# Patient Record
Sex: Female | Born: 1985 | Race: White | Hispanic: No | Marital: Single | Smoking: Current every day smoker
Health system: Southern US, Community
[De-identification: ages and names within clinical notes are randomized; demographics above are authoritative.]

## PROBLEM LIST (undated history)

## (undated) DIAGNOSIS — F909 Attention-deficit hyperactivity disorder, unspecified type: Secondary | ICD-10-CM

## (undated) HISTORY — PX: WISDOM TOOTH EXTRACTION: SHX21

---
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
## 2003-02-10 ENCOUNTER — Other Ambulatory Visit: Admission: RE | Admit: 2003-02-10 | Discharge: 2003-02-10 | Payer: Self-pay | Admitting: Obstetrics and Gynecology

## 2003-02-11 ENCOUNTER — Other Ambulatory Visit: Admission: RE | Admit: 2003-02-11 | Discharge: 2003-02-11 | Payer: Self-pay | Admitting: Obstetrics and Gynecology

## 2003-06-14 ENCOUNTER — Encounter: Admission: RE | Admit: 2003-06-14 | Discharge: 2003-06-14 | Payer: Self-pay | Admitting: Obstetrics and Gynecology

## 2003-08-31 ENCOUNTER — Inpatient Hospital Stay (HOSPITAL_COMMUNITY): Admission: AD | Admit: 2003-08-31 | Discharge: 2003-08-31 | Payer: Self-pay | Admitting: Obstetrics and Gynecology

## 2003-09-01 ENCOUNTER — Inpatient Hospital Stay (HOSPITAL_COMMUNITY): Admission: AD | Admit: 2003-09-01 | Discharge: 2003-09-03 | Payer: Self-pay | Admitting: Obstetrics and Gynecology

## 2003-09-05 ENCOUNTER — Encounter: Admission: RE | Admit: 2003-09-05 | Discharge: 2003-10-05 | Payer: Self-pay | Admitting: Obstetrics and Gynecology

## 2003-10-06 ENCOUNTER — Other Ambulatory Visit: Admission: RE | Admit: 2003-10-06 | Discharge: 2003-10-06 | Payer: Self-pay | Admitting: Obstetrics and Gynecology

## 2003-10-06 ENCOUNTER — Encounter: Admission: RE | Admit: 2003-10-06 | Discharge: 2003-11-05 | Payer: Self-pay | Admitting: Obstetrics and Gynecology

## 2003-11-06 ENCOUNTER — Encounter: Admission: RE | Admit: 2003-11-06 | Discharge: 2003-12-06 | Payer: Self-pay | Admitting: Obstetrics and Gynecology

## 2003-11-10 ENCOUNTER — Emergency Department (HOSPITAL_COMMUNITY): Admission: EM | Admit: 2003-11-10 | Discharge: 2003-11-10 | Payer: Self-pay | Admitting: Emergency Medicine

## 2007-06-23 ENCOUNTER — Emergency Department (HOSPITAL_COMMUNITY): Admission: EM | Admit: 2007-06-23 | Discharge: 2007-06-23 | Payer: Self-pay | Admitting: Emergency Medicine

## 2007-09-08 ENCOUNTER — Emergency Department (HOSPITAL_COMMUNITY): Admission: EM | Admit: 2007-09-08 | Discharge: 2007-09-08 | Payer: Self-pay | Admitting: Emergency Medicine

## 2007-12-03 ENCOUNTER — Emergency Department (HOSPITAL_COMMUNITY): Admission: EM | Admit: 2007-12-03 | Discharge: 2007-12-03 | Payer: Self-pay | Admitting: Emergency Medicine

## 2008-06-13 ENCOUNTER — Emergency Department (HOSPITAL_COMMUNITY): Admission: EM | Admit: 2008-06-13 | Discharge: 2008-06-13 | Payer: Self-pay | Admitting: Emergency Medicine

## 2009-01-08 ENCOUNTER — Inpatient Hospital Stay (HOSPITAL_COMMUNITY): Admission: AD | Admit: 2009-01-08 | Discharge: 2009-01-08 | Payer: Self-pay | Admitting: Obstetrics and Gynecology

## 2009-01-13 ENCOUNTER — Emergency Department (HOSPITAL_COMMUNITY): Admission: EM | Admit: 2009-01-13 | Discharge: 2009-01-13 | Payer: Self-pay | Admitting: Emergency Medicine

## 2009-01-25 ENCOUNTER — Inpatient Hospital Stay (HOSPITAL_COMMUNITY): Admission: AD | Admit: 2009-01-25 | Discharge: 2009-01-26 | Payer: Self-pay | Admitting: Obstetrics and Gynecology

## 2009-01-29 ENCOUNTER — Inpatient Hospital Stay (HOSPITAL_COMMUNITY): Admission: AD | Admit: 2009-01-29 | Discharge: 2009-02-01 | Payer: Self-pay | Admitting: Obstetrics & Gynecology

## 2010-11-12 LAB — URINE MICROSCOPIC-ADD ON

## 2010-11-12 LAB — CBC
HCT: 34.5 % — ABNORMAL LOW (ref 36.0–46.0)
Hemoglobin: 10.9 g/dL — ABNORMAL LOW (ref 12.0–15.0)
Hemoglobin: 12 g/dL (ref 12.0–15.0)
MCHC: 34.5 g/dL (ref 30.0–36.0)
MCHC: 34.8 g/dL (ref 30.0–36.0)
MCV: 89.9 fL (ref 78.0–100.0)
Platelets: 253 10*3/uL (ref 150–400)
RBC: 3.83 MIL/uL — ABNORMAL LOW (ref 3.87–5.11)
RDW: 13.6 % (ref 11.5–15.5)
RDW: 13.6 % (ref 11.5–15.5)
WBC: 16.3 10*3/uL — ABNORMAL HIGH (ref 4.0–10.5)

## 2010-11-12 LAB — GC/CHLAMYDIA PROBE AMP, GENITAL
Chlamydia, DNA Probe: NEGATIVE
GC Probe Amp, Genital: NEGATIVE

## 2010-11-12 LAB — URINALYSIS, ROUTINE W REFLEX MICROSCOPIC
Hgb urine dipstick: NEGATIVE
Specific Gravity, Urine: 1.03 (ref 1.005–1.030)
Urobilinogen, UA: 1 mg/dL (ref 0.0–1.0)

## 2010-11-12 LAB — WET PREP, GENITAL

## 2010-12-21 NOTE — Op Note (Signed)
NAMEQUINCY, Virginia Wong                        ACCOUNT NO.:  [id number]   MEDICAL RECORD NO.:  [id number]                   PATIENT TYPE:  INP   LOCATION:  9117                                 FACILITY:  [facility]   PHYSICIAN:  Dineen Kid. Rana Snare, M.D.                 DATE OF BIRTH:  [birth year]   DATE OF PROCEDURE:  09/02/2003  DATE OF DISCHARGE:                                 OPERATIVE REPORT   The patient presented to the hospital in active labor with spontaneous  rupture of membranes.  She progressed rapidly to complete.  Did not receive  pain medication or epidural due to patient's request upon complete dilation.  The patient was having difficulty with controlling her anxiety and emotions  as were her husband and family members.  Upon my cervical check, the husband  was inappropriately using the video camera near the vagina and when asked to  refrain from using the camera until the baby was delivered, he got very  upset with the nurse and was upset also with me.  After pushing for  approximately 10-15 minutes, we had a fetal heart rate deceleration to 60-70  beats per minute for approximately 4-5 minutes.  Responded with position  change and oxygen.  However, the patient was being noncompliant as far as  turning and very argumentative with the nurse.   I discussed with the patient different options to get the baby delivered.  The baby was in the right occiput anterior presentation with a +3 station,  and we continued to have big decels with pushing.  We did get some recovery  with rest on the left lateral position change and oxygen.  Discussed  cesarean section, use of forceps, or vacuum for an operative vaginal  delivery.  After discussion with the patient, the patient requested the  vacuum extractor.  Did discuss the 1 in 50,000 risk of suffocating or bleed,  at which time the husband began shouting No, you're not doing that.  You're  going to suck out the baby's brain.  We're doing a  C-section.  At that  point I stated, Fine, let's go ahead and do the cesarean section  and  began to proceed to call the operating room, at which time the husband  shouted at me that he going to carry the video camera into the operating  room and do anything he damn well wants to.  I told him that he would only  be able to take it in there if he was in control and not video tape anything  he wants but only as directed, at which time he stated he wanted a new  doctor.  I told him that at this point, for care with the fetal heart rate  in jeopardy, that my responsibility was for the mother and the baby and that  he would need to leave the room so that I could properly take  care of the  mother and the baby.  After refusing to do so, Isla Pence was contacted,  who was the head nurse on call.  She was able to get him to leave the room  and take him down the hall and talk with him.   The patient then consented to the vacuum extractor after we explained the  risks.  It was gently applied, and because of caput on the fetus and the  patient not able to push well and actually clamping her legs down when the  fetus would crown, unable to keep the vacuum on because she would clamp down  around the vacuum so after two easy attempts bringing the fetus to a  crowning position, the vacuum was easily removed and at this time the heart  rate was in the 130s to 140s.  Because the heart rate had resumed a normal  position other than with variables with pushing, I allowed her to push on  her own and after several contractions with pushing, she did deliver  spontaneously.  The nose and pharynx were suctioned.  The shoulders were  easily reduced.  The cord was clamped, cut and baby was resuscitated on the  mother's abdomen.   Blood gas was obtained with a pH arterial of 7.27.  The placenta was then  delivered spontaneously and intact three-vessel cord.  One percent Xylocaine  was used to infiltrate the  lower portion of the vagina and perineum, where  she had a small first-degree laceration into the vagina, which was repaired  with 2-0 chromic.  The uterus was massaged firm with minimal bleeding noted.  Estimated blood loss of 400 cc.  The mother and the baby were doing very  well.   At this point the father had been back in the room for the delivery and had  been markedly calmed down and appeared to be apologetic and thankful for the  safe delivery.                                               Dineen Kid Rana Snare, M.D.    DCL/MEDQ  D:  09/02/2003  T:  09/02/2003  Job:  [id number]

## 2011-04-30 LAB — URINALYSIS, ROUTINE W REFLEX MICROSCOPIC
Bilirubin Urine: NEGATIVE
Glucose, UA: NEGATIVE
Specific Gravity, Urine: 1.009
Urobilinogen, UA: 0.2
pH: 6.5

## 2011-04-30 LAB — POCT PREGNANCY, URINE
Operator id: 288331
Preg Test, Ur: NEGATIVE

## 2011-04-30 LAB — URINE MICROSCOPIC-ADD ON

## 2011-04-30 LAB — WET PREP, GENITAL

## 2011-05-08 LAB — CBC
HCT: 39.1
Hemoglobin: 13.1
MCHC: 33.5
MCV: 89.3
Platelets: 210
RBC: 4.38
RDW: 13.1
WBC: 7.5

## 2011-05-08 LAB — URINALYSIS, ROUTINE W REFLEX MICROSCOPIC
Bilirubin Urine: NEGATIVE
Glucose, UA: NEGATIVE
Hgb urine dipstick: NEGATIVE
Ketones, ur: NEGATIVE
Nitrite: NEGATIVE
Protein, ur: NEGATIVE
Specific Gravity, Urine: 1.021
Urobilinogen, UA: 0.2
pH: 6.5

## 2011-05-08 LAB — RPR: RPR Ser Ql: NONREACTIVE

## 2011-05-08 LAB — POCT I-STAT, CHEM 8
BUN: 9
Calcium, Ion: 1.23
Chloride: 102
HCT: 39
Potassium: 3.6

## 2011-05-08 LAB — GC/CHLAMYDIA PROBE AMP, GENITAL
Chlamydia, DNA Probe: NEGATIVE
GC Probe Amp, Genital: NEGATIVE

## 2011-05-08 LAB — HCG, QUANTITATIVE, PREGNANCY: hCG, Beta Chain, Quant, S: 25714 — ABNORMAL HIGH

## 2011-05-08 LAB — POCT PREGNANCY, URINE: Preg Test, Ur: POSITIVE

## 2011-05-08 LAB — WET PREP, GENITAL
Trich, Wet Prep: NONE SEEN
Yeast Wet Prep HPF POC: NONE SEEN

## 2011-05-08 LAB — ABO/RH: ABO/RH(D): A POS

## 2013-10-15 ENCOUNTER — Emergency Department (HOSPITAL_COMMUNITY): Payer: Medicaid Other

## 2013-10-15 ENCOUNTER — Emergency Department (HOSPITAL_COMMUNITY)
Admission: EM | Admit: 2013-10-15 | Discharge: 2013-10-15 | Disposition: A | Discharge status: Home / Self Care | Payer: Medicaid Other | Attending: Emergency Medicine | Admitting: Emergency Medicine

## 2013-10-15 ENCOUNTER — Encounter (HOSPITAL_COMMUNITY): Payer: Self-pay | Admitting: Emergency Medicine

## 2013-10-15 DIAGNOSIS — K625 Hemorrhage of anus and rectum: Secondary | ICD-10-CM

## 2013-10-15 DIAGNOSIS — Z79899 Other long term (current) drug therapy: Secondary | ICD-10-CM | POA: Insufficient documentation

## 2013-10-15 DIAGNOSIS — Z3202 Encounter for pregnancy test, result negative: Secondary | ICD-10-CM | POA: Insufficient documentation

## 2013-10-15 DIAGNOSIS — N898 Other specified noninflammatory disorders of vagina: Secondary | ICD-10-CM | POA: Insufficient documentation

## 2013-10-15 DIAGNOSIS — R109 Unspecified abdominal pain: Secondary | ICD-10-CM

## 2013-10-15 DIAGNOSIS — K648 Other hemorrhoids: Secondary | ICD-10-CM | POA: Insufficient documentation

## 2013-10-15 DIAGNOSIS — F17200 Nicotine dependence, unspecified, uncomplicated: Secondary | ICD-10-CM | POA: Insufficient documentation

## 2013-10-15 LAB — WET PREP, GENITAL
Trich, Wet Prep: NONE SEEN
WBC, Wet Prep HPF POC: NONE SEEN
Yeast Wet Prep HPF POC: NONE SEEN

## 2013-10-15 LAB — CBC WITH DIFFERENTIAL/PLATELET
Basophils Absolute: 0 10*3/uL (ref 0.0–0.1)
Basophils Relative: 0 % (ref 0–1)
EOS PCT: 1 % (ref 0–5)
Eosinophils Absolute: 0.1 10*3/uL (ref 0.0–0.7)
HEMATOCRIT: 50.4 % — AB (ref 36.0–46.0)
Hemoglobin: 17.5 g/dL — ABNORMAL HIGH (ref 12.0–15.0)
LYMPHS ABS: 1.8 10*3/uL (ref 0.7–4.0)
LYMPHS PCT: 14 % (ref 12–46)
MCH: 31.4 pg (ref 26.0–34.0)
MCHC: 34.7 g/dL (ref 30.0–36.0)
MCV: 90.3 fL (ref 78.0–100.0)
MONO ABS: 1.1 10*3/uL — AB (ref 0.1–1.0)
Monocytes Relative: 8 % (ref 3–12)
Neutro Abs: 9.7 10*3/uL — ABNORMAL HIGH (ref 1.7–7.7)
Neutrophils Relative %: 76 % (ref 43–77)
PLATELETS: 347 10*3/uL (ref 150–400)
RBC: 5.58 MIL/uL — AB (ref 3.87–5.11)
RDW: 12.6 % (ref 11.5–15.5)
WBC: 12.7 10*3/uL — AB (ref 4.0–10.5)

## 2013-10-15 LAB — URINALYSIS, ROUTINE W REFLEX MICROSCOPIC
Glucose, UA: NEGATIVE mg/dL
HGB URINE DIPSTICK: NEGATIVE
KETONES UR: 15 mg/dL — AB
Nitrite: NEGATIVE
PROTEIN: NEGATIVE mg/dL
Specific Gravity, Urine: 1.038 — ABNORMAL HIGH (ref 1.005–1.030)
Urobilinogen, UA: 1 mg/dL (ref 0.0–1.0)
pH: 5 (ref 5.0–8.0)

## 2013-10-15 LAB — COMPREHENSIVE METABOLIC PANEL
ALT: 19 U/L (ref 0–35)
AST: 19 U/L (ref 0–37)
Albumin: 4.5 g/dL (ref 3.5–5.2)
Alkaline Phosphatase: 64 U/L (ref 39–117)
BILIRUBIN TOTAL: 0.8 mg/dL (ref 0.3–1.2)
BUN: 13 mg/dL (ref 6–23)
CALCIUM: 9.5 mg/dL (ref 8.4–10.5)
CO2: 27 meq/L (ref 19–32)
Chloride: 98 mEq/L (ref 96–112)
Creatinine, Ser: 0.77 mg/dL (ref 0.50–1.10)
GLUCOSE: 110 mg/dL — AB (ref 70–99)
Potassium: 4.3 mEq/L (ref 3.7–5.3)
Sodium: 138 mEq/L (ref 137–147)
Total Protein: 7.3 g/dL (ref 6.0–8.3)

## 2013-10-15 LAB — URINE MICROSCOPIC-ADD ON

## 2013-10-15 LAB — POC OCCULT BLOOD, ED: FECAL OCCULT BLD: POSITIVE — AB

## 2013-10-15 LAB — POC URINE PREG, ED: Preg Test, Ur: NEGATIVE

## 2013-10-15 MED ORDER — DICYCLOMINE HCL 10 MG PO CAPS: 10.0000 mg | ORAL_CAPSULE | Freq: Three times a day (TID) | ORAL | Status: DC

## 2013-10-15 MED ORDER — DOCUSATE SODIUM 100 MG PO CAPS: 100.0000 mg | ORAL_CAPSULE | Freq: Two times a day (BID) | ORAL | Status: DC

## 2013-10-15 MED ORDER — DICYCLOMINE HCL 10 MG PO CAPS
10.0000 mg | ORAL_CAPSULE | Freq: Once | ORAL | Status: AC
  Administered 2013-10-15: 10 mg via ORAL
  Filled 2013-10-15: qty 1

## 2013-10-15 NOTE — ED Notes (Signed)
Assisted PA with pelvic examination. Specimen sent.

## 2013-10-15 NOTE — ED Notes (Addendum)
Pt presents with c/o of rectal bleeding. Pt reports that she has had diarrhea since 10:00 this morning. Pt reports that she has only had one episode of rectal bleeding, which was at 1500 and the patient reports that the feces was "nothing but blood" and was bright red. Pt reports nausea, however denies emesis. Pt reports intermittent mid-lower abdominal pain. Pt also reports fatigue and weakness which began this morning.  Pt is A/O x4, in NAD, and vitals are WDL.

## 2013-10-15 NOTE — ED Provider Notes (Signed)
Medical screening examination/treatment/procedure(s) were performed by non-physician practitioner and as supervising physician I was immediately available for consultation/collaboration.   EKG Interpretation None        Richardean Canalavid H Paisley Grajeda, MD 10/15/13 2332

## 2013-10-15 NOTE — Discharge Instructions (Signed)
Abdominal Pain, Women °Abdominal (stomach, pelvic, or belly) pain can be caused by many things. It is important to tell your doctor: °· The location of the pain. °· Does it come and go or is it present all the time? °· Are there things that start the pain (eating certain foods, exercise)? °· Are there other symptoms associated with the pain (fever, nausea, vomiting, diarrhea)? °All of this is helpful to know when trying to find the cause of the pain. °CAUSES  °· Stomach: virus or bacteria infection, or ulcer. °· Intestine: appendicitis (inflamed appendix), regional ileitis (Crohn's disease), ulcerative colitis (inflamed colon), irritable bowel syndrome, diverticulitis (inflamed diverticulum of the colon), or cancer of the stomach or intestine. °· Gallbladder disease or stones in the gallbladder. °· Kidney disease, kidney stones, or infection. °· Pancreas infection or cancer. °· Fibromyalgia (pain disorder). °· Diseases of the female organs: °· Uterus: fibroid (non-cancerous) tumors or infection. °· Fallopian tubes: infection or tubal pregnancy. °· Ovary: cysts or tumors. °· Pelvic adhesions (scar tissue). °· Endometriosis (uterus lining tissue growing in the pelvis and on the pelvic organs). °· Pelvic congestion syndrome (female organs filling up with blood just before the menstrual period). °· Pain with the menstrual period. °· Pain with ovulation (producing an egg). °· Pain with an IUD (intrauterine device, birth control) in the uterus. °· Cancer of the female organs. °· Functional pain (pain not caused by a disease, may improve without treatment). °· Psychological pain. °· Depression. °DIAGNOSIS  °Your doctor will decide the seriousness of your pain by doing an examination. °· Blood tests. °· X-rays. °· Ultrasound. °· CT scan (computed tomography, special type of X-ray). °· MRI (magnetic resonance imaging). °· Cultures, for infection. °· Barium enema (dye inserted in the large intestine, to better view it with  X-rays). °· Colonoscopy (looking in intestine with a lighted tube). °· Laparoscopy (minor surgery, looking in abdomen with a lighted tube). °· Major abdominal exploratory surgery (looking in abdomen with a large incision). °TREATMENT  °The treatment will depend on the cause of the pain.  °· Many cases can be observed and treated at home. °· Over-the-counter medicines recommended by your caregiver. °· Prescription medicine. °· Antibiotics, for infection. °· Birth control pills, for painful periods or for ovulation pain. °· Hormone treatment, for endometriosis. °· Nerve blocking injections. °· Physical therapy. °· Antidepressants. °· Counseling with a psychologist or psychiatrist. °· Minor or major surgery. °HOME CARE INSTRUCTIONS  °· Do not take laxatives, unless directed by your caregiver. °· Take over-the-counter pain medicine only if ordered by your caregiver. Do not take aspirin because it can cause an upset stomach or bleeding. °· Try a clear liquid diet (broth or water) as ordered by your caregiver. Slowly move to a bland diet, as tolerated, if the pain is related to the stomach or intestine. °· Have a thermometer and take your temperature several times a day, and record it. °· Bed rest and sleep, if it helps the pain. °· Avoid sexual intercourse, if it causes pain. °· Avoid stressful situations. °· Keep your follow-up appointments and tests, as your caregiver orders. °· If the pain does not go away with medicine or surgery, you may try: °· Acupuncture. °· Relaxation exercises (yoga, meditation). °· Group therapy. °· Counseling. °SEEK MEDICAL CARE IF:  °· You notice certain foods cause stomach pain. °· Your home care treatment is not helping your pain. °· You need stronger pain medicine. °· You want your IUD removed. °· You feel faint or   lightheaded.  You develop nausea and vomiting.  You develop a rash.  You are having side effects or an allergy to your medicine. SEEK IMMEDIATE MEDICAL CARE IF:   Your  pain does not go away or gets worse.  You have a fever.  Your pain is felt only in portions of the abdomen. The right side could possibly be appendicitis. The left lower portion of the abdomen could be colitis or diverticulitis.  You are passing blood in your stools (bright red or black tarry stools, with or without vomiting).  You have blood in your urine.  You develop chills, with or without a fever.  You pass out. MAKE SURE YOU:   Understand these instructions.  Will watch your condition.  Will get help right away if you are not doing well or get worse. Document Released: 05/19/2007 Document Revised: 10/14/2011 Document Reviewed: 06/08/2009 [facility] Patient Information 2014 [place], [place].  Bloody Stools Bloody stools means there is blood in your poop (stool). It is a sign that there is a problem somewhere in the digestive system. It is important for your doctor to find the cause of your bleeding, so the problem can be treated.  HOME CARE  Only take medicine as told by your doctor.  Eat foods with fiber (prunes, bran cereals).  Drink enough fluids to keep your pee (urine) clear or pale yellow.  Sit in warm water (sitz bath) for 10 to 15 minutes as told by your doctor.  Know how to take your medicines (enemas, suppositories) if advised by your doctor.  Watch for signs that you are getting better or getting worse. GET HELP RIGHT AWAY IF:   You are not getting better.  You start to get better but then get worse again.  You have new problems.  You have severe bleeding from the place where poop comes out (rectum) that does not stop.  You throw up (vomit) blood.  You feel weak or pass out (faint).  You have a fever. MAKE SURE YOU:   Understand these instructions.  Will watch your condition.  Will get help right away if you are not doing well or get worse. Document Released: 07/10/2009 Document Revised: 10/14/2011 Document Reviewed: 12/07/2010 [facility]  Patient Information 2014 [place], [place].  Rectal Bleeding Rectal bleeding is when blood passes out of the anus. It is usually a sign that something is wrong. It may not be serious, but it should always be evaluated. Rectal bleeding may present as bright red blood or extremely dark stools. The color may range from dark red or maroon to black (like tar). It is important that the cause of rectal bleeding be identified so treatment can be started and the problem corrected. CAUSES   Hemorrhoids. These are enlarged (dilated) blood vessels or veins in the anal or rectal area.  Fistulas. Theseare abnormal, burrowing channels that usually run from inside the rectum to the skin around the anus. They can bleed.  Anal fissures. This is a tear in the tissue of the anus. Bleeding occurs with bowel movements.  Diverticulosis. This is a condition in which pockets or sacs project from the bowel wall. Occasionally, the sacs can bleed.  Diverticulitis. Thisis an infection involving diverticulosis of the colon.  Proctitis and colitis. These are conditions in which the rectum, colon, or both, can become inflamed and pitted (ulcerated).  Polyps and cancer. Polyps are non-cancerous (benign) growths in the colon that may bleed. Certain types of polyps turn into cancer.  Protrusion of the  rectum. Part of the rectum can project from the anus and bleed.  Certain medicines.  Intestinal infections.  Blood vessel abnormalities. HOME CARE INSTRUCTIONS  Eat a high-fiber diet to keep your stool soft.  Limit activity.  Drink enough fluids to keep your urine clear or pale yellow.  Warm baths may be useful to soothe rectal pain.  Follow up with your caregiver as directed. SEEK IMMEDIATE MEDICAL CARE IF:  You develop increased bleeding.  You have black or dark red stools.  You vomit blood or material that looks like coffee grounds.  You have abdominal pain or tenderness.  You have a fever.  You feel  weak, nauseous, or you faint.  You have severe rectal pain or you are unable to have a bowel movement. MAKE SURE YOU:  Understand these instructions.  Will watch your condition.  Will get help right away if you are not doing well or get worse. Document Released: 01/11/2002 Document Revised: 10/14/2011 Document Reviewed: 01/06/2011 [facility] Patient Information 2014 [place]

## 2013-10-15 NOTE — ED Provider Notes (Signed)
CSN: [id number]     Arrival date & time 10/15/13  1553 History   First MD Initiated Contact with Patient 10/15/13 1754     Chief Complaint  Patient presents with  . Rectal Bleeding     (Consider location/radiation/quality/duration/timing/severity/associated sxs/prior Treatment) HPI Comments: Patient is otherwise healthy 28 year old female who presents to the ED with BRBPR today.  She states that over the past month she has been having episodic crampy lower abdominal pain, she states that she is also noting that she has to strain to have a bowel movement, she reports that once earlier in the month she had stool that had streaks of blood in the stool - she reports today she strained then had hard stools and then bloody diarrhea.  She reports frequently after the bouts of constipation, she will experience the diarrhea and the cramps.  She denies fever, chills, nausea or vomiting but reports some white vaginal discharge associated with this.  She reports no family or personal history of colon cancer, crohn's disease or ulcerative colitis.    Patient is a 28 y.o. female presenting with hematochezia. The history is provided by the patient. No language interpreter was used.  Rectal Bleeding Quality:  Bright red Amount:  Moderate Duration:  4 hours Timing:  Constant Progression:  Worsening Chronicity:  Recurrent Context: constipation, defecation, diarrhea and spontaneously   Context: not anal fissures, not foreign body, not hemorrhoids, not rectal injury and not rectal pain   Similar prior episodes: no   Relieved by:  Nothing Worsened by:  Nothing tried Ineffective treatments:  None tried Associated symptoms: abdominal pain   Associated symptoms: no dizziness, no epistaxis, no fever, no hematemesis, no light-headedness, no loss of consciousness, no recent illness and no vomiting   Risk factors: no anticoagulant use, no hx of colorectal cancer and no hx of IBD     History reviewed. No pertinent  past medical history. Past Surgical History  Procedure Laterality Date  . Wisdom tooth extraction     No family history on file. History  Substance Use Topics  . Smoking status: Current Every Day Smoker -- 0.50 packs/day for 5 years    Types: Cigarettes  . Smokeless tobacco: Never Used  . Alcohol Use: 12.0 oz/week    20 Shots of liquor per week   OB History   Grav Para Term Preterm Abortions TAB SAB Ect Mult Living                 Review of Systems  Constitutional: Negative for fever.  HENT: Negative for nosebleeds.   Gastrointestinal: Positive for abdominal pain and hematochezia. Negative for vomiting and hematemesis.  Neurological: Negative for dizziness, loss of consciousness and light-headedness.  All other systems reviewed and are negative.      Allergies  Review of patient's allergies indicates no known allergies.  Home Medications   Current Outpatient Rx  Name  Route  Sig  Dispense  Refill  . amphetamine-dextroamphetamine (ADDERALL) 20 MG tablet   Oral   Take 20 mg by mouth 2 (two) times daily.         . naproxen sodium (ANAPROX) 220 MG tablet   Oral   Take 440 mg by mouth as needed (pain).         Marland Kitchen. omeprazole (PRILOSEC) 40 MG capsule   Oral   Take 40 mg by mouth daily.          BP 133/85  Pulse 90  Temp(Src) 98.5 F (36.9  C) (Oral)  Resp 18  SpO2 98%  LMP 09/17/2013 Physical Exam  Nursing note and vitals reviewed. Constitutional: She is oriented to person, place, and time. She appears well-developed and well-nourished. No distress.  HENT:  Head: Normocephalic and atraumatic.  Right Ear: External ear normal.  Left Ear: External ear normal.  Nose: Nose normal.  Mouth/Throat: Oropharynx is clear and moist. No oropharyngeal exudate.  Eyes: Conjunctivae are normal. Pupils are equal, round, and reactive to light. No scleral icterus.  Neck: Normal range of motion. Neck supple.  Cardiovascular: Normal rate, regular rhythm and normal heart  sounds.  Exam reveals no gallop and no friction rub.   No murmur heard. Pulmonary/Chest: Effort normal and breath sounds normal. No respiratory distress. She has no wheezes. She has no rales. She exhibits no tenderness.  Abdominal: Soft. Bowel sounds are normal. She exhibits no distension and no mass. There is tenderness. There is no rebound and no guarding.    Genitourinary: Rectal exam shows internal hemorrhoid. Rectal exam shows no external hemorrhoid, no fissure, no tenderness and anal tone normal. There is no rash, tenderness or lesion on the right labia. There is no rash, tenderness or lesion on the left labia. Uterus is tender. Uterus is not enlarged. Cervix exhibits no motion tenderness and no discharge. Right adnexum displays no mass, no tenderness and no fullness. Left adnexum displays no mass, no tenderness and no fullness. No tenderness or bleeding around the vagina. No foreign body around the vagina. No vaginal discharge found.  Musculoskeletal: Normal range of motion. She exhibits no edema and no tenderness.  Lymphadenopathy:    She has no cervical adenopathy.  Neurological: She is alert and oriented to person, place, and time. She exhibits normal muscle tone. Coordination normal.  Skin: Skin is warm and dry. No rash noted. No erythema. No pallor.  Psychiatric: She has a normal mood and affect. Her behavior is normal. Judgment and thought content normal.    ED Course  Procedures (including critical care time) Labs Review Labs Reviewed  CBC WITH DIFFERENTIAL - Abnormal; Notable for the following:    WBC 12.7 (*)    RBC 5.58 (*)    Hemoglobin 17.5 (*)    HCT 50.4 (*)    Neutro Abs 9.7 (*)    Monocytes Absolute 1.1 (*)    All other components within normal limits  COMPREHENSIVE METABOLIC PANEL - Abnormal; Notable for the following:    Glucose, Bld 110 (*)    All other components within normal limits  URINALYSIS, ROUTINE W REFLEX MICROSCOPIC - Abnormal; Notable for the  following:    Color, Urine AMBER (*)    APPearance CLOUDY (*)    Specific Gravity, Urine 1.038 (*)    Bilirubin Urine MODERATE (*)    Ketones, ur 15 (*)    Leukocytes, UA TRACE (*)    All other components within normal limits  POC OCCULT BLOOD, ED - Abnormal; Notable for the following:    Fecal Occult Bld POSITIVE (*)    All other components within normal limits  GC/CHLAMYDIA PROBE AMP  WET PREP, GENITAL  URINE MICROSCOPIC-ADD ON  RPR  HIV ANTIBODY (ROUTINE TESTING)  POC URINE PREG, ED   Imaging Review Dg Abd Acute W/chest  10/15/2013   CLINICAL DATA:  Chest pain rectal bleeding  EXAM: ACUTE ABDOMEN SERIES (ABDOMEN 2 VIEW & CHEST 1 VIEW)  COMPARISON:  None.  FINDINGS: There is no evidence of dilated bowel loops or free intraperitoneal air. No radiopaque calculi  or other significant radiographic abnormality is seen. Heart size and mediastinal contours are within normal limits. Both lungs are clear.  IMPRESSION: No acute abnormality noted.   Electronically Signed   By: Alcide Clever M.D.   On: 10/15/2013 19:01     EKG Interpretation None      Results for orders placed during the hospital encounter of 10/15/13  WET PREP, GENITAL      Result Value Ref Range   Yeast Wet Prep HPF POC NONE SEEN  NONE SEEN   Trich, Wet Prep NONE SEEN  NONE SEEN   Clue Cells Wet Prep HPF POC RARE (*) NONE SEEN   WBC, Wet Prep HPF POC NONE SEEN  NONE SEEN  CBC WITH DIFFERENTIAL      Result Value Ref Range   WBC 12.7 (*) 4.0 - 10.5 K/uL   RBC 5.58 (*) 3.87 - 5.11 MIL/uL   Hemoglobin 17.5 (*) 12.0 - 15.0 g/dL   HCT 16.1 (*) 09.6 - 04.5 %   MCV 90.3  78.0 - 100.0 fL   MCH 31.4  26.0 - 34.0 pg   MCHC 34.7  30.0 - 36.0 g/dL   RDW 40.9  81.1 - 91.4 %   Platelets 347  150 - 400 K/uL   Neutrophils Relative % 76  43 - 77 %   Neutro Abs 9.7 (*) 1.7 - 7.7 K/uL   Lymphocytes Relative 14  12 - 46 %   Lymphs Abs 1.8  0.7 - 4.0 K/uL   Monocytes Relative 8  3 - 12 %   Monocytes Absolute 1.1 (*) 0.1 - 1.0 K/uL     Eosinophils Relative 1  0 - 5 %   Eosinophils Absolute 0.1  0.0 - 0.7 K/uL   Basophils Relative 0  0 - 1 %   Basophils Absolute 0.0  0.0 - 0.1 K/uL  COMPREHENSIVE METABOLIC PANEL      Result Value Ref Range   Sodium 138  137 - 147 mEq/L   Potassium 4.3  3.7 - 5.3 mEq/L   Chloride 98  96 - 112 mEq/L   CO2 27  19 - 32 mEq/L   Glucose, Bld 110 (*) 70 - 99 mg/dL   BUN 13  6 - 23 mg/dL   Creatinine, Ser 7.82  0.50 - 1.10 mg/dL   Calcium 9.5  8.4 - 95.6 mg/dL   Total Protein 7.3  6.0 - 8.3 g/dL   Albumin 4.5  3.5 - 5.2 g/dL   AST 19  0 - 37 U/L   ALT 19  0 - 35 U/L   Alkaline Phosphatase 64  39 - 117 U/L   Total Bilirubin 0.8  0.3 - 1.2 mg/dL   GFR calc non Af Amer >90  >90 mL/min   GFR calc Af Amer >90  >90 mL/min  URINALYSIS, ROUTINE W REFLEX MICROSCOPIC      Result Value Ref Range   Color, Urine AMBER (*) YELLOW   APPearance CLOUDY (*) CLEAR   Specific Gravity, Urine 1.038 (*) 1.005 - 1.030   pH 5.0  5.0 - 8.0   Glucose, UA NEGATIVE  NEGATIVE mg/dL   Hgb urine dipstick NEGATIVE  NEGATIVE   Bilirubin Urine MODERATE (*) NEGATIVE   Ketones, ur 15 (*) NEGATIVE mg/dL   Protein, ur NEGATIVE  NEGATIVE mg/dL   Urobilinogen, UA 1.0  0.0 - 1.0 mg/dL   Nitrite NEGATIVE  NEGATIVE   Leukocytes, UA TRACE (*) NEGATIVE  URINE MICROSCOPIC-ADD ON  Result Value Ref Range   Squamous Epithelial / LPF RARE  RARE   WBC, UA 0-2  <3 WBC/hpf   RBC / HPF 0-2  <3 RBC/hpf   Bacteria, UA RARE  RARE   Urine-Other MUCOUS PRESENT    POC URINE PREG, ED      Result Value Ref Range   Preg Test, Ur NEGATIVE  NEGATIVE  POC OCCULT BLOOD, ED      Result Value Ref Range   Fecal Occult Bld POSITIVE (*) NEGATIVE   Dg Abd Acute W/chest  10/15/2013   CLINICAL DATA:  Chest pain rectal bleeding  EXAM: ACUTE ABDOMEN SERIES (ABDOMEN 2 VIEW & CHEST 1 VIEW)  COMPARISON:  None.  FINDINGS: There is no evidence of dilated bowel loops or free intraperitoneal air. No radiopaque calculi or other significant  radiographic abnormality is seen. Heart size and mediastinal contours are within normal limits. Both lungs are clear.  IMPRESSION: No acute abnormality noted.   Electronically Signed   By: Alcide Clever M.D.   On: 10/15/2013 19:01    Medications  dicyclomine (BENTYL) capsule 10 mg (10 mg Oral Given 10/15/13 2000)     MDM  Rectal Bleeding Abdominal pain   Patient is otherwise healthy 28 year old female who presents with crampy abdominal pain, rectal bleeding, HGB stable, heme positive from below, no need for transfusion, abdominal pain relieved with bentyl.  Plan on referral to GI if this continues.    Izola Price Marisue Humble, PA-C 10/15/13 2040

## 2013-10-15 NOTE — ED Notes (Signed)
Initial Contact - pt to RM1, changed to hospital gown.  Pt reports 1 episode BRBPR earlier today, reports diarrhea all day today.  Pt c/o 7/10 "cramping" lower abd pain.  Pt denies n/v, denies fevers/chills.  Denies other complaints.  Skin PWD.  A+Ox4.  MAEI.  NAD.

## 2013-10-16 LAB — GC/CHLAMYDIA PROBE AMP
CT PROBE, AMP APTIMA: NEGATIVE
GC Probe RNA: NEGATIVE

## 2013-10-16 LAB — RPR: RPR Ser Ql: NONREACTIVE

## 2013-10-16 LAB — HIV ANTIBODY (ROUTINE TESTING W REFLEX): HIV: NONREACTIVE

## 2018-03-23 ENCOUNTER — Other Ambulatory Visit: Payer: Self-pay

## 2018-03-23 ENCOUNTER — Emergency Department (INDEPENDENT_AMBULATORY_CARE_PROVIDER_SITE_OTHER)
Admission: EM | Admit: 2018-03-23 | Discharge: 2018-03-23 | Disposition: A | Discharge status: Home / Self Care | Payer: 59 | Source: Home / Self Care | Attending: Family Medicine | Admitting: Family Medicine

## 2018-03-23 DIAGNOSIS — R102 Pelvic and perineal pain unspecified side: Secondary | ICD-10-CM

## 2018-03-23 DIAGNOSIS — N760 Acute vaginitis: Secondary | ICD-10-CM

## 2018-03-23 DIAGNOSIS — J029 Acute pharyngitis, unspecified: Secondary | ICD-10-CM

## 2018-03-23 NOTE — Discharge Instructions (Signed)
°  You will be notified of today's lab results in about 2-3 days.    Please call to schedule a follow up appointment with a primary care provider for recheck of symptoms in 1 week if not improving.

## 2018-03-23 NOTE — ED Triage Notes (Signed)
Pt has been having a sore throat and vaginal pain x 2 days.

## 2018-03-23 NOTE — ED Provider Notes (Signed)
[facility]    CSN: [id number] Arrival date & time: 03/23/18  1058     History   Chief Complaint Chief Complaint  Patient presents with  . Sore Throat  . Vaginal Pain    HPI Virginia Wong is a 32 y.o. female.   HPI Virginia Wong is a 32 y.o. female presenting to UC with c/o sore throat and vaginal pain for about 2 days.  Throat pain is aching and sore, worse with swallowing, 3/10. Vaginal pain is itching and burning. She has had yeast infections in the past and notes she completed a course of antibiotics about 1 week ago for a UTI. Denies urinary symptoms. Denies fever, chills, cough, congestion, n/vd. No known sick contacts. Denies concern for STDs.   History reviewed. No pertinent past medical history.  There are no active problems to display for this patient.   Past Surgical History:  Procedure Laterality Date  . WISDOM TOOTH EXTRACTION      OB History   None      Home Medications    Prior to Admission medications   Medication Sig Start Date End Date Taking? Authorizing Provider  amphetamine-dextroamphetamine (ADDERALL) 20 MG tablet Take 20 mg by mouth 2 (two) times daily.    [provider]  dicyclomine (BENTYL) 10 MG capsule Take 1 capsule (10 mg total) by mouth 4 (four) times daily -  before meals and at bedtime. 10/15/13   Cherrie DistanceSanford, Frances, PA-C  docusate sodium (COLACE) 100 MG capsule Take 1 capsule (100 mg total) by mouth every 12 (twelve) hours. 10/15/13   Cherrie DistanceSanford, Frances, PA-C  naproxen sodium (ANAPROX) 220 MG tablet Take 440 mg by mouth as needed (pain).    [provider]  omeprazole (PRILOSEC) 40 MG capsule Take 40 mg by mouth daily.    [provider]    Family History History reviewed. No pertinent family history.  Social History Social History   Tobacco Use  . Smoking status: Current Every Day Smoker    Packs/day: 0.50    Years: 5.00    Pack years: 2.50    Types: Cigarettes  . Smokeless tobacco:  Never Used  Substance Use Topics  . Alcohol use: Yes    Alcohol/week: 20.0 standard drinks    Types: 20 Shots of liquor per week  . Drug use: No     Allergies   Patient has no known allergies.   Review of Systems Review of Systems  Constitutional: Negative for chills and fever.  HENT: Positive for sore throat. Negative for congestion, ear pain, trouble swallowing and voice change.   Respiratory: Negative for cough and shortness of breath.   Cardiovascular: Negative for chest pain and palpitations.  Gastrointestinal: Negative for abdominal pain, diarrhea, nausea and vomiting.  Genitourinary: Positive for vaginal pain (itching and burning). Negative for dysuria, frequency, hematuria, vaginal bleeding and vaginal discharge.  Musculoskeletal: Negative for arthralgias, back pain and myalgias.  Skin: Negative for rash.     Physical Exam Triage Vital Signs ED Triage Vitals  Enc Vitals Group     BP 03/23/18 1120 129/85     Pulse Rate 03/23/18 1120 73     Resp --      Temp 03/23/18 1120 98 F (36.7 C)     Temp Source 03/23/18 1120 Oral     SpO2 03/23/18 1120 95 %     Weight 03/23/18 1121 158 lb (71.7 kg)     Height 03/23/18 1121 5\' 5"  (1.651 m)  Head Circumference --      Peak Flow --      Pain Score 03/23/18 1121 1     Pain Loc --      Pain Edu? --      Excl. in GC? --    No data found.  Updated Vital Signs BP 129/85 (BP Location: Right Arm)   Pulse 73   Temp 98 F (36.7 C) (Oral)   Ht 5\' 5"  (1.651 m)   Wt 158 lb (71.7 kg)   SpO2 95%   BMI 26.29 kg/m   Visual Acuity Right Eye Distance:   Left Eye Distance:   Bilateral Distance:    Right Eye Near:   Left Eye Near:    Bilateral Near:     Physical Exam  Constitutional: She is oriented to person, place, and time. She appears well-developed and well-nourished.  Non-toxic appearance. She does not appear ill. No distress.  HENT:  Head: Normocephalic and atraumatic.  Right Ear: Tympanic membrane normal.    Left Ear: Tympanic membrane normal.  Nose: Nose normal.  Mouth/Throat: Uvula is midline and mucous membranes are normal. Posterior oropharyngeal edema and posterior oropharyngeal erythema present. No oropharyngeal exudate or tonsillar abscesses.  Eyes: EOM are normal.  Neck: Normal range of motion. Neck supple. No thyromegaly present.  Cardiovascular: Normal rate and regular rhythm.  Pulmonary/Chest: Effort normal and breath sounds normal.  Musculoskeletal: Normal range of motion.  Lymphadenopathy:    She has no cervical adenopathy.  Neurological: She is alert and oriented to person, place, and time.  Skin: Skin is warm and dry.  Psychiatric: She has a normal mood and affect. Her behavior is normal.  Nursing note and vitals reviewed.    UC Treatments / Results  Labs (all labs ordered are listed, but only abnormal results are displayed) Labs Reviewed  WET PREP BY MOLECULAR PROBE  C. TRACHOMATIS/N. GONORRHOEAE RNA  STREP A DNA PROBE  POCT RAPID STREP A (OFFICE)    EKG None  Radiology No results found.  Procedures Procedures (including critical care time)  Medications Ordered in UC Medications - No data to display  Initial Impression / Assessment and Plan / UC Course  I have reviewed the triage vital signs and the nursing notes.  Pertinent labs & imaging results that were available during my care of the patient were reviewed by me and considered in my medical decision making (see chart for details).     Rapid strep: NEGATIVE Culture sent Pt provided vaginal self swab to send for Wet Prep Home care instruction provided.   Final Clinical Impressions(s) / UC Diagnoses   Final diagnoses:  Vaginal pain  Sore throat  Pharyngitis, unspecified etiology  Vaginitis and vulvovaginitis     Discharge Instructions      You will be notified of today's lab results in about 2-3 days.    Please call to schedule a follow up appointment with a primary care provider for  recheck of symptoms in 1 week if not improving.     ED Prescriptions    None     Controlled Substance Prescriptions [place] Controlled Substance Registry consulted? Not Applicable   Rolla Platehelps, Virgie Kunda O, PA-C 03/23/18 1248

## 2018-03-24 ENCOUNTER — Telehealth: Payer: Self-pay | Admitting: Emergency Medicine

## 2018-03-24 LAB — WET PREP BY MOLECULAR PROBE
Candida species: NOT DETECTED
MICRO NUMBER:: 90984033
SPECIMEN QUALITY:: ADEQUATE
Trichomonas vaginosis: NOT DETECTED

## 2018-03-24 LAB — C. TRACHOMATIS/N. GONORRHOEAE RNA
C. trachomatis RNA, TMA: NOT DETECTED
N. gonorrhoeae RNA, TMA: NOT DETECTED

## 2018-03-24 LAB — STREP A DNA PROBE: Group A Strep Probe: NOT DETECTED

## 2018-06-13 ENCOUNTER — Other Ambulatory Visit: Payer: Self-pay

## 2018-06-13 ENCOUNTER — Emergency Department (INDEPENDENT_AMBULATORY_CARE_PROVIDER_SITE_OTHER)
Admission: EM | Admit: 2018-06-13 | Discharge: 2018-06-13 | Disposition: A | Discharge status: Home / Self Care | Payer: 59 | Source: Home / Self Care | Attending: Family Medicine | Admitting: Family Medicine

## 2018-06-13 DIAGNOSIS — B9789 Other viral agents as the cause of diseases classified elsewhere: Secondary | ICD-10-CM

## 2018-06-13 DIAGNOSIS — J069 Acute upper respiratory infection, unspecified: Secondary | ICD-10-CM | POA: Diagnosis not present

## 2018-06-13 DIAGNOSIS — J0110 Acute frontal sinusitis, unspecified: Secondary | ICD-10-CM

## 2018-06-13 MED ORDER — AMOXICILLIN 875 MG PO TABS: 875.0000 mg | ORAL_TABLET | Freq: Two times a day (BID) | ORAL | 0 refills | Status: DC

## 2018-06-13 MED ORDER — FLUCONAZOLE 150 MG PO TABS: ORAL_TABLET | ORAL | 0 refills | Status: DC

## 2018-06-13 NOTE — ED Triage Notes (Signed)
Pt c/o runny nose that turned into thick green mucous x 4 days. Dry cough associated with. Taking Alkaseltzer prn.

## 2018-06-13 NOTE — ED Provider Notes (Signed)
[facility]    CSN: [id number] Arrival date & time: 06/13/18  0940     History   Chief Complaint Chief Complaint  Patient presents with  . Cough    Possible sinus infection    HPI Virginia Wong is a 33 y.o. female.   Five days ago patient developed nasal congestion and rhinorrhea.  Last night she developed a cough and today she has felt fatigued and cold with pressure in her forehead.  She has a past history of frequent episodes of otitis media as a child.  The history is provided by the patient.    History reviewed. No pertinent past medical history.  There are no active problems to display for this patient.   Past Surgical History:  Procedure Laterality Date  . WISDOM TOOTH EXTRACTION      OB History   None      Home Medications    Prior to Admission medications   Medication Sig Start Date End Date Taking? Authorizing Provider  amoxicillin (AMOXIL) 875 MG tablet Take 1 tablet (875 mg total) by mouth 2 (two) times daily. 06/13/18   Lattie Haw, MD  amphetamine-dextroamphetamine (ADDERALL) 20 MG tablet Take 20 mg by mouth 2 (two) times daily.    [provider]  dicyclomine (BENTYL) 10 MG capsule Take 1 capsule (10 mg total) by mouth 4 (four) times daily -  before meals and at bedtime. 10/15/13   Cherrie Distance, PA-C  docusate sodium (COLACE) 100 MG capsule Take 1 capsule (100 mg total) by mouth every 12 (twelve) hours. 10/15/13   Cherrie Distance, PA-C  naproxen sodium (ANAPROX) 220 MG tablet Take 440 mg by mouth as needed (pain).    [provider]  omeprazole (PRILOSEC) 40 MG capsule Take 40 mg by mouth daily.    [provider]    Family History History reviewed. No pertinent family history.  Social History Social History   Tobacco Use  . Smoking status: Current Every Day Smoker    Packs/day: 0.50    Years: 5.00    Pack years: 2.50    Types: Cigarettes  . Smokeless tobacco: Never Used  Substance Use  Topics  . Alcohol use: Yes    Alcohol/week: 20.0 standard drinks    Types: 20 Shots of liquor per week  . Drug use: No     Allergies   Patient has no known allergies.   Review of Systems Review of Systems No sore throat + hoarse + cough No pleuritic pain No wheezing + nasal congestion + post-nasal drainage + sinus pain/pressure No itchy/red eyes No earache No hemoptysis No SOB No fever/chills + nausea No vomiting No abdominal pain No diarrhea No urinary symptoms No skin rash + fatigue No myalgias + headache Used OTC meds without relief   Physical Exam Triage Vital Signs ED Triage Vitals  Enc Vitals Group     BP 06/13/18 1031 122/82     Pulse Rate 06/13/18 1031 77     Resp 06/13/18 1031 18     Temp 06/13/18 1031 99.2 F (37.3 C)     Temp Source 06/13/18 1031 Oral     SpO2 06/13/18 1031 98 %     Weight 06/13/18 1032 172 lb (78 kg)     Height 06/13/18 1032 5\' 5"  (1.651 m)     Head Circumference --      Peak Flow --      Pain Score 06/13/18 1031 0  Pain Loc --      Pain Edu? --      Excl. in GC? --    No data found.  Updated Vital Signs BP 122/82 (BP Location: Right Arm)   Pulse 77   Temp 99.2 F (37.3 C) (Oral)   Resp 18   Ht 5\' 5"  (1.651 m)   Wt 78 kg   SpO2 98%   BMI 28.62 kg/m   Visual Acuity Right Eye Distance:   Left Eye Distance:   Bilateral Distance:    Right Eye Near:   Left Eye Near:    Bilateral Near:     Physical Exam Nursing notes and Vital Signs reviewed. Appearance:  Patient appears stated age, and in no acute distress Eyes:  Pupils are equal, round, and reactive to light and accomodation.  Extraocular movement is intact.  Conjunctivae are not inflamed  Ears:  Canals normal.  Tympanic membranes normal.  Nose:  Congested turbinates.  Frontal sinus tenderness is present.  Pharynx:  Normal Neck:  Supple.  Enlarged posterior/lateral nodes are palpated bilaterally, tender to palpation on the left.   Lungs:  Clear to  auscultation.  Breath sounds are equal.  Moving air well. Heart:  Regular rate and rhythm without murmurs, rubs, or gallops.  Abdomen:  Nontender without masses or hepatosplenomegaly.  Bowel sounds are present.  No CVA or flank tenderness.  Extremities:  No edema.  Skin:  No rash present.    UC Treatments / Results  Labs (all labs ordered are listed, but only abnormal results are displayed) Labs Reviewed - No data to display  EKG None  Radiology No results found.  Procedures Procedures (including critical care time)  Medications Ordered in UC Medications - No data to display  Initial Impression / Assessment and Plan / UC Course  I have reviewed the triage vital signs and the nursing notes.  Pertinent labs & imaging results that were available during my care of the patient were reviewed by me and considered in my medical decision making (see chart for details).    Suspect sinusitis and developing viral URI.  Begin amoxicillin.  Rx for Diflucan at patient's request.  Followup with Family Doctor if not improved in about 10 days.   Final Clinical Impressions(s) / UC Diagnoses   Final diagnoses:  Viral URI with cough  Acute frontal sinusitis, recurrence not specified     Discharge Instructions     Take plain guaifenesin (1200mg  extended release tabs such as Mucinex) twice daily, with plenty of water, for cough and congestion.  May add Pseudoephedrine (30mg , one or two every 4 to 6 hours) for sinus congestion.  Get adequate rest.   May use Afrin nasal spray (or generic oxymetazoline) each morning for about 5 days and then discontinue.  Also recommend using saline nasal spray several times daily and saline nasal irrigation (AYR is a common brand).  Use Flonase nasal spray each morning after using Afrin nasal spray and saline nasal irrigation. Try warm salt water gargles for sore throat.  Stop all antihistamines for now, and other non-prescription cough/cold preparations. May  take Ibuprofen 200mg , 4 tabs every 8 hours with food for headache, sore throat, body aches, etc. May take Delsym Cough Suppressant at bedtime for nighttime cough.     ED Prescriptions    Medication Sig Dispense Auth. Provider   amoxicillin (AMOXIL) 875 MG tablet Take 1 tablet (875 mg total) by mouth 2 (two) times daily. 20 tablet Lattie Haw, MD  Lattie Haw, MD 06/16/18 2219

## 2018-06-13 NOTE — Discharge Instructions (Addendum)
Take plain guaifenesin (1200mg  extended release tabs such as Mucinex) twice daily, with plenty of water, for cough and congestion.  May add Pseudoephedrine (30mg , one or two every 4 to 6 hours) for sinus congestion.  Get adequate rest.   May use Afrin nasal spray (or generic oxymetazoline) each morning for about 5 days and then discontinue.  Also recommend using saline nasal spray several times daily and saline nasal irrigation (AYR is a common brand).  Use Flonase nasal spray each morning after using Afrin nasal spray and saline nasal irrigation. Try warm salt water gargles for sore throat.  Stop all antihistamines for now, and other non-prescription cough/cold preparations. May take Ibuprofen 200mg , 4 tabs every 8 hours with food for headache, sore throat, body aches, etc. May take Delsym Cough Suppressant at bedtime for nighttime cough.

## 2018-06-15 ENCOUNTER — Emergency Department (INDEPENDENT_AMBULATORY_CARE_PROVIDER_SITE_OTHER): Payer: 59

## 2018-06-15 ENCOUNTER — Emergency Department (INDEPENDENT_AMBULATORY_CARE_PROVIDER_SITE_OTHER)
Admission: EM | Admit: 2018-06-15 | Discharge: 2018-06-15 | Disposition: A | Discharge status: Home / Self Care | Payer: 59 | Source: Home / Self Care | Attending: Family Medicine | Admitting: Family Medicine

## 2018-06-15 DIAGNOSIS — J040 Acute laryngitis: Secondary | ICD-10-CM

## 2018-06-15 DIAGNOSIS — R110 Nausea: Secondary | ICD-10-CM

## 2018-06-15 DIAGNOSIS — J069 Acute upper respiratory infection, unspecified: Secondary | ICD-10-CM

## 2018-06-15 DIAGNOSIS — R05 Cough: Secondary | ICD-10-CM

## 2018-06-15 DIAGNOSIS — R6889 Other general symptoms and signs: Secondary | ICD-10-CM

## 2018-06-15 DIAGNOSIS — R0989 Other specified symptoms and signs involving the circulatory and respiratory systems: Secondary | ICD-10-CM | POA: Diagnosis not present

## 2018-06-15 MED ORDER — AZITHROMYCIN 250 MG PO TABS: 250.0000 mg | ORAL_TABLET | Freq: Every day | ORAL | 0 refills | Status: DC

## 2018-06-15 MED ORDER — ONDANSETRON 4 MG PO TBDP: 4.0000 mg | ORAL_TABLET | Freq: Four times a day (QID) | ORAL | 0 refills | Status: DC | PRN

## 2018-06-15 NOTE — ED Triage Notes (Signed)
Patient was seen 2 days ago and treated for sinus infection. She reports the antibiotic made her throat sore and hard to swallow so she quit taking it. She reports she feels worse today with chest tightness.

## 2018-06-15 NOTE — Discharge Instructions (Signed)
°  You may take 500mg  acetaminophen every 4-6 hours or in combination with ibuprofen 400-600mg  every 6-8 hours as needed for pain, inflammation, and fever.  Be sure to stay well hydrated and get at least 8 hours of sleep at night, preferably more while sick.   Please take antibiotics as prescribed and be sure to complete entire course even if you start to feel better to ensure infection does not come back. If you have questions/concerns about the antibiotic, please call our office.  Please follow up with family medicine in 1 week if not improving.

## 2018-06-16 NOTE — ED Provider Notes (Signed)
[facility]    CSN: [id number] Arrival date & time: 06/15/18  1035     History   Chief Complaint Chief Complaint  Patient presents with  . Cough  . Fatigue    HPI Virginia Wong is a 32 y.o. female.   HPI Virginia Wong is a 32 y.o. female presenting to UC with c/o gradually worsening sore throat, cough, congestion, chest tightness, and fatigue. She was seen on 06/13/18, dx with a sinus infection and prescribed amoxicillin but she stopped taking it because it made her throat pain worse. She has not taken anything for pain today.  Nausea but no vomiting or diarrhea. No known fever.    No past medical history on file.  There are no active problems to display for this patient.   Past Surgical History:  Procedure Laterality Date  . WISDOM TOOTH EXTRACTION      OB History   None      Home Medications    Prior to Admission medications   Medication Sig Start Date End Date Taking? Authorizing Provider  amphetamine-dextroamphetamine (ADDERALL) 20 MG tablet Take 20 mg by mouth 2 (two) times daily.    [provider]  azithromycin (ZITHROMAX) 250 MG tablet Take 1 tablet (250 mg total) by mouth daily. Take first 2 tablets together, then 1 every day until finished. 06/15/18   Lurene ShadowPhelps, Montserrat Shek O, PA-C  dicyclomine (BENTYL) 10 MG capsule Take 1 capsule (10 mg total) by mouth 4 (four) times daily -  before meals and at bedtime. 10/15/13   Cherrie DistanceSanford, Frances, PA-C  docusate sodium (COLACE) 100 MG capsule Take 1 capsule (100 mg total) by mouth every 12 (twelve) hours. 10/15/13   Cherrie DistanceSanford, Frances, PA-C  fluconazole (DIFLUCAN) 150 MG tablet Take one tab PO once for yeast infection.  May repeat in 72 hours. 06/13/18   Lattie HawBeese, Stephen A, MD  naproxen sodium (ANAPROX) 220 MG tablet Take 440 mg by mouth as needed (pain).    [provider]  omeprazole (PRILOSEC) 40 MG capsule Take 40 mg by mouth daily.    [provider]  ondansetron (ZOFRAN ODT) 4 MG  disintegrating tablet Take 1 tablet (4 mg total) by mouth every 6 (six) hours as needed for nausea or vomiting. 06/15/18   Lurene ShadowPhelps, Nerida Boivin O, PA-C    Family History No family history on file.  Social History Social History   Tobacco Use  . Smoking status: Current Every Day Smoker    Packs/day: 0.50    Years: 5.00    Pack years: 2.50    Types: Cigarettes  . Smokeless tobacco: Never Used  Substance Use Topics  . Alcohol use: Yes    Alcohol/week: 20.0 standard drinks    Types: 20 Shots of liquor per week  . Drug use: No     Allergies   Patient has no known allergies.   Review of Systems Review of Systems  Constitutional: Positive for appetite change and fatigue. Negative for chills and fever.  HENT: Positive for congestion, postnasal drip, sinus pressure, sinus pain and sore throat. Negative for ear pain.   Respiratory: Positive for cough and chest tightness. Negative for shortness of breath and wheezing.   Gastrointestinal: Positive for nausea. Negative for abdominal pain, diarrhea and vomiting.  Neurological: Positive for headaches. Negative for dizziness and light-headedness.     Physical Exam Triage Vital Signs ED Triage Vitals  Enc Vitals Group     BP 06/15/18 1048 (!) 144/80  Pulse Rate 06/15/18 1048 78     Resp 06/15/18 1048 14     Temp 06/15/18 1048 99.2 F (37.3 C)     Temp Source 06/15/18 1048 Oral     SpO2 06/15/18 1048 97 %     Weight 06/15/18 1049 171 lb (77.6 kg)     Height --      Head Circumference --      Peak Flow --      Pain Score 06/15/18 1048 5     Pain Loc --      Pain Edu? --      Excl. in GC? --    No data found.  Updated Vital Signs BP (!) 144/80 (BP Location: Right Arm)   Pulse 78   Temp 99.2 F (37.3 C) (Oral)   Resp 14   Wt 171 lb (77.6 kg)   SpO2 97%   BMI 28.46 kg/m   Visual Acuity Right Eye Distance:   Left Eye Distance:   Bilateral Distance:    Right Eye Near:   Left Eye Near:    Bilateral Near:     Physical  Exam  Constitutional: She is oriented to person, place, and time. She appears well-developed and well-nourished. No distress.  HENT:  Head: Normocephalic and atraumatic.  Right Ear: Tympanic membrane normal.  Left Ear: Tympanic membrane normal.  Nose: Mucosal edema present. Right sinus exhibits no maxillary sinus tenderness and no frontal sinus tenderness. Left sinus exhibits no maxillary sinus tenderness and no frontal sinus tenderness.  Mouth/Throat: Uvula is midline, oropharynx is clear and moist and mucous membranes are normal.  Eyes: EOM are normal.  Neck: Normal range of motion. Neck supple.  Cardiovascular: Normal rate and regular rhythm.  Pulmonary/Chest: Effort normal and breath sounds normal. No stridor. No respiratory distress. She has no wheezes. She has no rales.  Hoarse voice but no stridor  Musculoskeletal: Normal range of motion.  Neurological: She is alert and oriented to person, place, and time.  Skin: Skin is warm and dry. She is not diaphoretic.  Psychiatric: She has a normal mood and affect. Her behavior is normal.  Nursing note and vitals reviewed.    UC Treatments / Results  Labs (all labs ordered are listed, but only abnormal results are displayed) Labs Reviewed - No data to display  EKG None  Radiology Dg Chest 2 View  Result Date: 06/15/2018 CLINICAL DATA:  Cough and chest tightness. EXAM: CHEST - 2 VIEW COMPARISON:  10/15/2013 FINDINGS: Normal heart size and mediastinal contours. No acute infiltrate or edema. No effusion or pneumothorax. Pectus excavatum. No acute osseous findings. IMPRESSION: Negative chest. Electronically Signed   By: Marnee Spring M.D.   On: 06/15/2018 11:16    Procedures Procedures (including critical care time)  Medications Ordered in UC Medications - No data to display  Initial Impression / Assessment and Plan / UC Course  I have reviewed the triage vital signs and the nursing notes.  Pertinent labs & imaging results  that were available during my care of the patient were reviewed by me and considered in my medical decision making (see chart for details).     CXR unremarkable Hx and exam c/w viral illness, however, will have pt start azithromycin for possible atypical bacteria as Dr. Cathren Harsh intented pt to be on antibiotic, amoxicillin, which she stopped taking due to size of pills causing worsening sore throat.  Home care instructions provided.  Final Clinical Impressions(s) / UC Diagnoses   Final diagnoses:  Upper respiratory tract infection, unspecified type  Flu-like symptoms  Laryngitis  Nausea without vomiting     Discharge Instructions      You may take 500mg  acetaminophen every 4-6 hours or in combination with ibuprofen 400-600mg  every 6-8 hours as needed for pain, inflammation, and fever.  Be sure to stay well hydrated and get at least 8 hours of sleep at night, preferably more while sick.   Please take antibiotics as prescribed and be sure to complete entire course even if you start to feel better to ensure infection does not come back. If you have questions/concerns about the antibiotic, please call our office.  Please follow up with family medicine in 1 week if not improving.      ED Prescriptions    Medication Sig Dispense Auth. Provider   azithromycin (ZITHROMAX) 250 MG tablet  (Status: Discontinued) Take 1 tablet (250 mg total) by mouth daily. Take first 2 tablets together, then 1 every day until finished. 6 tablet Doroteo Glassman, Calena Salem O, PA-C   ondansetron (ZOFRAN ODT) 4 MG disintegrating tablet  (Status: Discontinued) Take 1 tablet (4 mg total) by mouth every 6 (six) hours as needed for nausea or vomiting. 12 tablet Doroteo Glassman, Ariela Mochizuki O, PA-C   azithromycin (ZITHROMAX) 250 MG tablet Take 1 tablet (250 mg total) by mouth daily. Take first 2 tablets together, then 1 every day until finished. 6 tablet Doroteo Glassman, Orlando Devereux O, PA-C   ondansetron (ZOFRAN ODT) 4 MG disintegrating tablet Take 1 tablet (4 mg  total) by mouth every 6 (six) hours as needed for nausea or vomiting. 12 tablet Lurene Shadow, PA-C     Controlled Substance Prescriptions [place] Controlled Substance Registry consulted? Not Applicable   Rolla Plate 06/16/18 1639

## 2018-10-11 ENCOUNTER — Other Ambulatory Visit: Payer: Self-pay

## 2018-10-11 ENCOUNTER — Emergency Department (INDEPENDENT_AMBULATORY_CARE_PROVIDER_SITE_OTHER)
Admission: EM | Admit: 2018-10-11 | Discharge: 2018-10-11 | Disposition: A | Discharge status: Home / Self Care | Payer: 59 | Source: Home / Self Care

## 2018-10-11 ENCOUNTER — Encounter: Payer: Self-pay | Admitting: Emergency Medicine

## 2018-10-11 DIAGNOSIS — R82998 Other abnormal findings in urine: Secondary | ICD-10-CM | POA: Diagnosis not present

## 2018-10-11 DIAGNOSIS — N3000 Acute cystitis without hematuria: Secondary | ICD-10-CM | POA: Diagnosis not present

## 2018-10-11 DIAGNOSIS — R110 Nausea: Secondary | ICD-10-CM

## 2018-10-11 DIAGNOSIS — R1013 Epigastric pain: Secondary | ICD-10-CM

## 2018-10-11 LAB — POCT CBC W AUTO DIFF (K'VILLE URGENT CARE)

## 2018-10-11 LAB — POCT URINALYSIS DIP (MANUAL ENTRY)
Blood, UA: NEGATIVE
Glucose, UA: NEGATIVE mg/dL
Nitrite, UA: NEGATIVE
Protein Ur, POC: 30 mg/dL — AB
Spec Grav, UA: 1.025 (ref 1.010–1.025)
Urobilinogen, UA: 2 E.U./dL — AB
pH, UA: 7 (ref 5.0–8.0)

## 2018-10-11 LAB — POCT URINE PREGNANCY: Preg Test, Ur: NEGATIVE

## 2018-10-11 MED ORDER — CEPHALEXIN 500 MG PO CAPS: 500.0000 mg | ORAL_CAPSULE | Freq: Three times a day (TID) | ORAL | 0 refills | Status: DC

## 2018-10-11 MED ORDER — ONDANSETRON HCL 4 MG PO TABS: 4.0000 mg | ORAL_TABLET | Freq: Four times a day (QID) | ORAL | 0 refills | Status: DC

## 2018-10-11 MED ORDER — FLUCONAZOLE 150 MG PO TABS: 150.0000 mg | ORAL_TABLET | Freq: Once | ORAL | 0 refills | Status: AC

## 2018-10-11 NOTE — ED Provider Notes (Signed)
[name]    CSN: [id number] Arrival date & time: 10/11/18  1602     History   Chief Complaint Chief Complaint  Patient presents with  . Nausea  . Abdominal Pain    HPI VICTORIA MATTHIAS is a 33 y.o. female.   HPI  KIT SLEIMAN is a 33 y.o. female presenting to UC with c/o 2 days of intermittent sharp epigastric pain with associated chills, lower back pain and dark urine for about 6 days.  Hx of UTIs in the past.  She reports UTI symptoms about 1-2 months ago, she had a week's worth of amoxicillin. She took that antibiotic and her symptoms resolved until 2 days ago.  Denies fever, chills, vomiting or diarrhea. She has had mild nausea. No hx of abdominal surgeries.    History reviewed. No pertinent past medical history.  There are no active problems to display for this patient.   Past Surgical History:  Procedure Laterality Date  . WISDOM TOOTH EXTRACTION      OB History   No obstetric history on file.      Home Medications    Prior to Admission medications   Medication Sig Start Date End Date Taking? Authorizing Provider  amphetamine-dextroamphetamine (ADDERALL) 20 MG tablet Take 20 mg by mouth 2 (two) times daily.    [provider]  azithromycin (ZITHROMAX) 250 MG tablet Take 1 tablet (250 mg total) by mouth daily. Take first 2 tablets together, then 1 every day until finished. 06/15/18   Lurene Shadow, PA-C  cephALEXin (KEFLEX) 500 MG capsule Take 1 capsule (500 mg total) by mouth 3 (three) times daily. 10/11/18   Lurene Shadow, PA-C  dicyclomine (BENTYL) 10 MG capsule Take 1 capsule (10 mg total) by mouth 4 (four) times daily -  before meals and at bedtime. 10/15/13   Cherrie Distance, PA-C  docusate sodium (COLACE) 100 MG capsule Take 1 capsule (100 mg total) by mouth every 12 (twelve) hours. 10/15/13   Cherrie Distance, PA-C  fluconazole (DIFLUCAN) 150 MG tablet Take 1 tablet (150 mg total) by mouth once for 1 dose. May repeat in 3 days if  still having symptoms 10/11/18 10/11/18  Lurene Shadow, PA-C  naproxen sodium (ANAPROX) 220 MG tablet Take 440 mg by mouth as needed (pain).    [provider]  omeprazole (PRILOSEC) 40 MG capsule Take 40 mg by mouth daily.    [provider]  ondansetron (ZOFRAN ODT) 4 MG disintegrating tablet Take 1 tablet (4 mg total) by mouth every 6 (six) hours as needed for nausea or vomiting. 06/15/18   Lurene Shadow, PA-C  ondansetron (ZOFRAN) 4 MG tablet Take 1 tablet (4 mg total) by mouth every 6 (six) hours. 10/11/18   Lurene Shadow, PA-C    Family History History reviewed. No pertinent family history.  Social History Social History   Tobacco Use  . Smoking status: Current Every Day Smoker    Packs/day: 0.50    Years: 5.00    Pack years: 2.50    Types: Cigarettes  . Smokeless tobacco: Never Used  Substance Use Topics  . Alcohol use: Yes    Alcohol/week: 20.0 standard drinks    Types: 20 Shots of liquor per week  . Drug use: No     Allergies   Patient has no known allergies.   Review of Systems Review of Systems  Constitutional: Negative for chills and fever.  HENT: Negative for congestion, ear pain, sore  throat, trouble swallowing and voice change.   Respiratory: Negative for cough and shortness of breath.   Cardiovascular: Negative for chest pain and palpitations.  Gastrointestinal: Positive for abdominal pain and nausea. Negative for diarrhea and vomiting.  Genitourinary: Positive for dysuria and frequency. Negative for hematuria, pelvic pain and urgency.  Musculoskeletal: Positive for back pain. Negative for arthralgias and myalgias.  Skin: Negative for rash.     Physical Exam Triage Vital Signs ED Triage Vitals  Enc Vitals Group     BP 10/11/18 1614 119/74     Pulse Rate 10/11/18 1614 75     Resp --      Temp 10/11/18 1614 98.2 F (36.8 C)     Temp Source 10/11/18 1614 Oral     SpO2 10/11/18 1614 100 %     Weight 10/11/18 1625 164 lb 12.8 oz (74.8  kg)     Height 10/11/18 1625 5\' 6"  (1.676 m)     Head Circumference --      Peak Flow --      Pain Score 10/11/18 1625 10     Pain Loc --      Pain Edu? --      Excl. in GC? --    No data found.  Updated Vital Signs BP 119/74 (BP Location: Left Arm)   Pulse 75   Temp 98.2 F (36.8 C) (Oral)   Ht 5\' 6"  (1.676 m)   Wt 164 lb 12.8 oz (74.8 kg)   LMP 09/05/2018 (Approximate)   SpO2 100%   BMI 26.60 kg/m   Visual Acuity Right Eye Distance:   Left Eye Distance:   Bilateral Distance:    Right Eye Near:   Left Eye Near:    Bilateral Near:     Physical Exam Vitals signs and nursing note reviewed.  Constitutional:      Appearance: She is well-developed.  HENT:     Head: Normocephalic and atraumatic.     Mouth/Throat:     Mouth: Mucous membranes are moist.  Neck:     Musculoskeletal: Normal range of motion.  Cardiovascular:     Rate and Rhythm: Normal rate.  Pulmonary:     Effort: Pulmonary effort is normal.  Abdominal:     General: There is no distension.     Palpations: Abdomen is soft.     Tenderness: There is abdominal tenderness in the right upper quadrant, epigastric area, periumbilical area, left upper quadrant and left lower quadrant. There is no right CVA tenderness or left CVA tenderness.  Musculoskeletal: Normal range of motion.  Skin:    General: Skin is warm and dry.  Neurological:     Mental Status: She is alert and oriented to person, place, and time.  Psychiatric:        Behavior: Behavior normal.      UC Treatments / Results  Labs (all labs ordered are listed, but only abnormal results are displayed) Labs Reviewed  POCT URINALYSIS DIP (MANUAL ENTRY) - Abnormal; Notable for the following components:      Result Value   Bilirubin, UA small (*)    Ketones, POC UA small (15) (*)    Protein Ur, POC =30 (*)    Urobilinogen, UA 2.0 (*)    Leukocytes, UA Small (1+) (*)    All other components within normal limits  URINE CULTURE  COMPLETE  METABOLIC PANEL WITH GFR  LIPASE  POCT URINE PREGNANCY  POCT CBC W AUTO DIFF ([facility])  EKG None  Radiology No results found.  Procedures Procedures (including critical care time)  Medications Ordered in UC Medications - No data to display  Initial Impression / Assessment and Plan / UC Course  I have reviewed the triage vital signs and the nursing notes.  Pertinent labs & imaging results that were available during my care of the patient were reviewed by me and considered in my medical decision making (see chart for details).     UA concerning for a UTI Culture sent CBC: unremarkable Abdominal exam- diffuse tenderness w/o rebound or guarding. CMP and lipase: pending Will start pt on keflex for UTI, encouraged good oral hydration AVS provided Final Clinical Impressions(s) / UC Diagnoses   Final diagnoses:  Dark urine  Abdominal pain, epigastric  Acute cystitis without hematuria  Nausea without vomiting     Discharge Instructions      Please take your antibiotic as prescribed. A urine culture has been sent to check the severity of your urinary infection and to determine if you are on the most appropriate antibiotic. The results should come back within 2-3 days and you will be notified even if no medication change is needed.  Please stay well hydrated and follow up with your family doctor in 1 week if not improving, sooner if worsening.  Please call 911 or go to the hospital if symptoms significantly worsening.     ED Prescriptions    Medication Sig Dispense Auth. Provider   cephALEXin (KEFLEX) 500 MG capsule Take 1 capsule (500 mg total) by mouth 3 (three) times daily. 21 capsule Doroteo GlassmanPhelps, Nidhi Jacome O, PA-C   ondansetron (ZOFRAN) 4 MG tablet Take 1 tablet (4 mg total) by mouth every 6 (six) hours. 12 tablet Doroteo GlassmanPhelps, Cyncere Ruhe O, PA-C   fluconazole (DIFLUCAN) 150 MG tablet Take 1 tablet (150 mg total) by mouth once for 1 dose. May repeat in 3 days if still having  symptoms 2 tablet Lurene ShadowPhelps, Darol Cush O, PA-C     Controlled Substance Prescriptions [place] Controlled Substance Registry consulted? Not Applicable   Rolla Platehelps, Qianna Clagett O, PA-C 10/11/18 1747

## 2018-10-11 NOTE — Discharge Instructions (Signed)
°  Please take your antibiotic as prescribed. A urine culture has been sent to check the severity of your urinary infection and to determine if you are on the most appropriate antibiotic. The results should come back within 2-3 days and you will be notified even if no medication change is needed.  Please stay well hydrated and follow up with your family doctor in 1 week if not improving, sooner if worsening.  Please call 911 or go to the hospital if symptoms significantly worsening.

## 2018-10-11 NOTE — ED Triage Notes (Signed)
Here with 2 days sharp, intermit epigastric pains. Non radiating; chills, dark urine and lower back pain noted.  Reports menstrual late x6 days.

## 2018-10-12 LAB — COMPLETE METABOLIC PANEL WITH GFR
AG Ratio: 2.2 (calc) (ref 1.0–2.5)
ALT: 13 U/L (ref 6–29)
AST: 13 U/L (ref 10–30)
Albumin: 4.2 g/dL (ref 3.6–5.1)
Alkaline phosphatase (APISO): 46 U/L (ref 31–125)
BUN: 9 mg/dL (ref 7–25)
CO2: 29 mmol/L (ref 20–32)
Calcium: 9.3 mg/dL (ref 8.6–10.2)
Chloride: 104 mmol/L (ref 98–110)
Creat: 0.91 mg/dL (ref 0.50–1.10)
GFR, Est African American: 95 mL/min/{1.73_m2} (ref >=60)
GFR, Est Non African American: 82 mL/min/{1.73_m2} (ref >=60)
Globulin: 1.9 g/dL (calc) (ref 1.9–3.7)
Glucose, Bld: 101 mg/dL — ABNORMAL HIGH (ref 65–99)
Potassium: 4.3 mmol/L (ref 3.5–5.3)
Sodium: 141 mmol/L (ref 135–146)
Total Bilirubin: 0.3 mg/dL (ref 0.2–1.2)
Total Protein: 6.1 g/dL (ref 6.1–8.1)

## 2018-10-13 ENCOUNTER — Telehealth: Payer: Self-pay

## 2018-10-13 LAB — URINE CULTURE
MICRO NUMBER:: 292714
SPECIMEN QUALITY:: ADEQUATE

## 2018-10-13 LAB — LIPASE: Lipase: 12 U/L (ref 7–60)

## 2018-10-13 NOTE — Telephone Encounter (Signed)
Left message on VM with lab results and contact information for any questions. 

## 2019-01-02 ENCOUNTER — Encounter: Payer: Self-pay | Admitting: Emergency Medicine

## 2019-01-02 ENCOUNTER — Emergency Department (INDEPENDENT_AMBULATORY_CARE_PROVIDER_SITE_OTHER): Payer: 59

## 2019-01-02 ENCOUNTER — Emergency Department (INDEPENDENT_AMBULATORY_CARE_PROVIDER_SITE_OTHER)
Admission: EM | Admit: 2019-01-02 | Discharge: 2019-01-02 | Disposition: A | Discharge status: Home / Self Care | Payer: 59 | Source: Home / Self Care

## 2019-01-02 ENCOUNTER — Other Ambulatory Visit: Payer: Self-pay

## 2019-01-02 DIAGNOSIS — R0781 Pleurodynia: Secondary | ICD-10-CM

## 2019-01-02 DIAGNOSIS — S20222A Contusion of left back wall of thorax, initial encounter: Secondary | ICD-10-CM

## 2019-01-02 MED ORDER — METHOCARBAMOL 500 MG PO TABS: 500.0000 mg | ORAL_TABLET | Freq: Two times a day (BID) | ORAL | 0 refills | Status: DC

## 2019-01-02 MED ORDER — PREDNISONE 50 MG PO TABS: 50.0000 mg | ORAL_TABLET | Freq: Every day | ORAL | 0 refills | Status: AC
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
## 2019-01-02 NOTE — Discharge Instructions (Signed)
°  Robaxin (methocarbamol) is a muscle relaxer and may cause drowsiness. Do not drink alcohol, drive, or operate heavy machinery while taking.  You may take 500mg  acetaminophen every 4-6 hours or in combination with ibuprofen 400-600mg  every 6-8 hours as needed for pain and inflammation.  You may try alternating cool and warm packets to help with pain/discomfort.   Please follow up with family medicine or sports medicine if not improving in 1-2 weeks, sooner if you develop worsening pain, cough, congestion or fever as these are signs of possible pneumonia.  Call 911 or go to the emergency department if symptoms significantly worsening.
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
## 2019-01-06 ENCOUNTER — Telehealth: Payer: Self-pay | Admitting: *Deleted

## 2019-01-06 NOTE — Telephone Encounter (Signed)
Left patient a message with office policy information, needed records, and to call and answer screening questions.

## 2019-01-08 ENCOUNTER — Encounter: Payer: 59 | Admitting: Certified Nurse Midwife

## 2019-01-13 ENCOUNTER — Encounter: Payer: 59 | Admitting: Obstetrics and Gynecology

## 2019-06-13 ENCOUNTER — Encounter: Payer: Self-pay | Admitting: Emergency Medicine

## 2019-06-13 ENCOUNTER — Other Ambulatory Visit: Payer: Self-pay

## 2019-06-13 ENCOUNTER — Emergency Department (INDEPENDENT_AMBULATORY_CARE_PROVIDER_SITE_OTHER)
Admission: EM | Admit: 2019-06-13 | Discharge: 2019-06-13 | Disposition: A | Discharge status: Home / Self Care | Payer: 59 | Source: Home / Self Care | Attending: Family Medicine | Admitting: Family Medicine

## 2019-06-13 DIAGNOSIS — H1033 Unspecified acute conjunctivitis, bilateral: Secondary | ICD-10-CM

## 2019-06-13 HISTORY — DX: Attention-deficit hyperactivity disorder, unspecified type: F90.9

## 2019-06-13 MED ORDER — AZASITE 1 % OP SOLN: OPHTHALMIC | 0 refills | Status: DC

## 2019-06-13 NOTE — ED Provider Notes (Signed)
[facility]    CSN: [id number] Arrival date & time: 06/13/19  1124      History   Chief Complaint Chief Complaint  Patient presents with  . Conjunctivitis    bilateral    HPI Fuller SongJessica L Wong is a 33 y.o. female.   Patient complains of onset of redness of her left eye and lids two days ago, followed by similar symptoms in the right.  Her eyes are pruritic and slightly painful.  She has a mucoid discharge upon awakening each morning.  She denies changes in vision and she does not wear contacts.  She denies recent URI and sinus congestion. She is [redacted] weeks pregnant.  The history is provided by the patient.  Eye Problem Location:  Both eyes Quality:  Aching and burning Severity:  Mild Onset quality:  Sudden Duration:  2 days Timing:  Constant Progression:  Worsening Chronicity:  New Context: not burn, not contact lens problem, not foreign body, not using machinery, not scratch, not smoke exposure and not UV exposure   Relieved by:  None tried Worsened by:  Nothing Ineffective treatments:  None tried Associated symptoms: crusting, discharge, inflammation, itching, redness, swelling and tearing   Associated symptoms: no blurred vision, no decreased vision, no double vision, no facial rash, no headaches and no photophobia   Risk factors: no recent URI     Past Medical History:  Diagnosis Date  . ADHD     There are no active problems to display for this patient.   Past Surgical History:  Procedure Laterality Date  . WISDOM TOOTH EXTRACTION      OB History    Gravida  1   Para      Term      Preterm      AB      Living        SAB      TAB      Ectopic      Multiple      Live Births               Home Medications    Prior to Admission medications   Medication Sig Start Date End Date Taking? Authorizing Provider  Prenatal Vit-Fe Fumarate-FA (PRENATAL VITAMINS PO) Take by mouth.   Yes [provider]   amphetamine-dextroamphetamine (ADDERALL) 20 MG tablet Take 20 mg by mouth 2 (two) times daily.    [provider]  azithromycin (AZASITE) 1 % ophthalmic solution Place 1gtt in each eye BID (8-12hr) for two days, then one daily for 5 days 06/13/19   Lattie HawBeese, Shawnna Pancake A, MD  azithromycin (ZITHROMAX) 250 MG tablet Take 1 tablet (250 mg total) by mouth daily. Take first 2 tablets together, then 1 every day until finished. 06/15/18   Lurene ShadowPhelps, Erin O, PA-C  cephALEXin (KEFLEX) 500 MG capsule Take 1 capsule (500 mg total) by mouth 3 (three) times daily. 10/11/18   Lurene ShadowPhelps, Erin O, PA-C  dicyclomine (BENTYL) 10 MG capsule Take 1 capsule (10 mg total) by mouth 4 (four) times daily -  before meals and at bedtime. 10/15/13   Cherrie DistanceSanford, Frances, PA-C  docusate sodium (COLACE) 100 MG capsule Take 1 capsule (100 mg total) by mouth every 12 (twelve) hours. 10/15/13   Cherrie DistanceSanford, Frances, PA-C  methocarbamol (ROBAXIN) 500 MG tablet Take 1 tablet (500 mg total) by mouth 2 (two) times daily. 01/02/19   Lurene ShadowPhelps, Erin O, PA-C  naproxen sodium (ANAPROX) 220 MG tablet Take 440 mg by mouth  as needed (pain).    [provider]  omeprazole (PRILOSEC) 40 MG capsule Take 40 mg by mouth daily.    [provider]  ondansetron (ZOFRAN ODT) 4 MG disintegrating tablet Take 1 tablet (4 mg total) by mouth every 6 (six) hours as needed for nausea or vomiting. 06/15/18   Lurene Shadow, PA-C  ondansetron (ZOFRAN) 4 MG tablet Take 1 tablet (4 mg total) by mouth every 6 (six) hours. 10/11/18   Lurene Shadow, PA-C    Family History No family history on file.  Social History Social History   Tobacco Use  . Smoking status: Current Every Day Smoker    Packs/day: 0.50    Years: 5.00    Pack years: 2.50    Types: Cigarettes  . Smokeless tobacco: Never Used  Substance Use Topics  . Alcohol use: Yes    Alcohol/week: 20.0 standard drinks    Types: 20 Shots of liquor per week  . Drug use: No     Allergies   Patient has  no known allergies.   Review of Systems Review of Systems  Constitutional: Negative for chills, diaphoresis, fatigue and fever.  HENT: Negative for congestion, facial swelling, rhinorrhea, sinus pain and sore throat.   Eyes: Positive for discharge, redness and itching. Negative for blurred vision, double vision and photophobia.  Respiratory: Negative for cough.   Neurological: Negative for headaches.  All other systems reviewed and are negative.    Physical Exam Triage Vital Signs ED Triage Vitals  Enc Vitals Group     BP 06/13/19 1206 108/67     Pulse Rate 06/13/19 1206 86     Resp 06/13/19 1206 16     Temp 06/13/19 1206 98.8 F (37.1 C)     Temp Source 06/13/19 1206 Oral     SpO2 06/13/19 1206 99 %     Weight 06/13/19 1211 167 lb 8.8 oz (76 kg)     Height 06/13/19 1211 5' 5.5" (1.664 m)     Head Circumference --      Peak Flow --      Pain Score 06/13/19 1210 1     Pain Loc --      Pain Edu? --      Excl. in GC? --    No data found.  Updated Vital Signs BP 108/67 (BP Location: Right Arm)   Pulse 86   Temp 98.8 F (37.1 C) (Oral)   Resp 16   Ht 5' 5.5" (1.664 m)   Wt 76 kg   SpO2 99%   BMI 27.46 kg/m   Visual Acuity Right Eye Distance:   Left Eye Distance:   Bilateral Distance:    Right Eye Near:   Left Eye Near:    Bilateral Near:     Physical Exam Vitals signs and nursing note reviewed.  Constitutional:      General: She is not in acute distress. HENT:     Head: Normocephalic.     Right Ear: Tympanic membrane, ear canal and external ear normal.     Left Ear: Tympanic membrane, ear canal and external ear normal.     Nose: No congestion.     Mouth/Throat:     Pharynx: Oropharynx is clear.  Eyes:     General: Lids are everted, no foreign bodies appreciated.        Right eye: Discharge present. No foreign body or hordeolum.        Left eye: Discharge present.No foreign body  or hordeolum.     Extraocular Movements: Extraocular movements intact.      Conjunctiva/sclera:     Right eye: Right conjunctiva is injected. No chemosis.    Left eye: Left conjunctiva is injected. No chemosis.    Pupils: Pupils are equal, round, and reactive to light.      Comments: Conjunctivae both eyes mildly injected, with minimal upper eyelid margin erythema.  No eyelid swellling or tenderness.  Cardiovascular:     Rate and Rhythm: Normal rate.  Pulmonary:     Effort: Pulmonary effort is normal.  Lymphadenopathy:     Cervical: No cervical adenopathy.  Skin:    General: Skin is warm and dry.  Neurological:     Mental Status: She is alert.      UC Treatments / Results  Labs (all labs ordered are listed, but only abnormal results are displayed) Labs Reviewed - No data to display  EKG   Radiology No results found.  Procedures Procedures (including critical care time)  Medications Ordered in UC Medications - No data to display  Initial Impression / Assessment and Plan / UC Course  I have reviewed the triage vital signs and the nursing notes.  Pertinent labs & imaging results that were available during my care of the patient were reviewed by me and considered in my medical decision making (see chart for details).    Begin azithromycin ophthalmic solution. Followup with ophthalmologist if not improved 3 days.   Final Clinical Impressions(s) / UC Diagnoses   Final diagnoses:  Acute bacterial conjunctivitis of both eyes     Discharge Instructions     Avoid using contact lens until eye redness, pain, and swelling resolved.    ED Prescriptions    Medication Sig Dispense Auth. Provider   azithromycin (AZASITE) 1 % ophthalmic solution Place 1gtt in each eye BID (8-12hr) for two days, then one daily for 5 days 2.5 mL Kandra Nicolas, MD        Kandra Nicolas, MD 06/18/19 1302

## 2019-06-13 NOTE — Discharge Instructions (Addendum)
Avoid using contact lens until eye redness, pain, and swelling resolved.

## 2019-06-13 NOTE — ED Triage Notes (Signed)
Patient is here for bilateral conjunctivitis with thick discharge for past 2 days. She is [redacted] weeks pregnant with [facility] 11/16/18. Her eyes are itchy and slightly painful. She has had her influenza vacc this season.

## 2019-06-21 ENCOUNTER — Other Ambulatory Visit: Payer: Self-pay

## 2019-06-21 ENCOUNTER — Emergency Department (INDEPENDENT_AMBULATORY_CARE_PROVIDER_SITE_OTHER)
Admission: EM | Admit: 2019-06-21 | Discharge: 2019-06-21 | Disposition: A | Discharge status: Home / Self Care | Payer: 59 | Source: Home / Self Care | Attending: Family Medicine | Admitting: Family Medicine

## 2019-06-21 ENCOUNTER — Encounter: Payer: Self-pay | Admitting: Emergency Medicine

## 2019-06-21 DIAGNOSIS — Z20822 Contact with and (suspected) exposure to covid-19: Secondary | ICD-10-CM

## 2019-06-21 DIAGNOSIS — Z20828 Contact with and (suspected) exposure to other viral communicable diseases: Secondary | ICD-10-CM

## 2019-06-21 DIAGNOSIS — R05 Cough: Secondary | ICD-10-CM | POA: Diagnosis not present

## 2019-06-21 DIAGNOSIS — B9789 Other viral agents as the cause of diseases classified elsewhere: Secondary | ICD-10-CM

## 2019-06-21 DIAGNOSIS — J069 Acute upper respiratory infection, unspecified: Secondary | ICD-10-CM | POA: Diagnosis not present

## 2019-06-21 DIAGNOSIS — R519 Headache, unspecified: Secondary | ICD-10-CM | POA: Diagnosis not present

## 2019-06-21 LAB — POC SARS CORONAVIRUS 2 AG -  ED: SARS Coronavirus 2 Ag: NEGATIVE

## 2019-06-21 NOTE — ED Triage Notes (Signed)
Patient was with person who tested positive for covid 5 days ago; she has had a severe headache not responding to tylenol and excedrine; and fatigue. She is [redacted] weeks pregnant . Denies fever.

## 2019-06-21 NOTE — ED Provider Notes (Signed)
[facility]    CSN: [id number] Arrival date & time: 06/21/19  1553      History   Chief Complaint Chief Complaint  Patient presents with  . Headache  . Fatigue    HPI Virginia Wong is a 33 y.o. female.   Patient complains of four day history of headache, myalgias, chills, cough, and sinus congestion.  She denies chest tightness, pleuritic pain, and changes in taste/smell.  She had diarrhea yesterday, now resolved.  She reports that her boyfriend's grandfather tested positive for COVID19.  She is now [redacted] weeks pregnant.  The history is provided by the patient.    Past Medical History:  Diagnosis Date  . ADHD     There are no active problems to display for this patient.   Past Surgical History:  Procedure Laterality Date  . WISDOM TOOTH EXTRACTION      OB History    Gravida  1   Para      Term      Preterm      AB      Living        SAB      TAB      Ectopic      Multiple      Live Births               Home Medications    Prior to Admission medications   Medication Sig Start Date End Date Taking? Authorizing Provider  amphetamine-dextroamphetamine (ADDERALL) 20 MG tablet Take 20 mg by mouth 2 (two) times daily.    [provider]  azithromycin (AZASITE) 1 % ophthalmic solution Place 1gtt in each eye BID (8-12hr) for two days, then one daily for 5 days 06/13/19   Kandra Nicolas, MD  azithromycin (ZITHROMAX) 250 MG tablet Take 1 tablet (250 mg total) by mouth daily. Take first 2 tablets together, then 1 every day until finished. 06/15/18   Noe Gens, PA-C  cephALEXin (KEFLEX) 500 MG capsule Take 1 capsule (500 mg total) by mouth 3 (three) times daily. 10/11/18   Noe Gens, PA-C  dicyclomine (BENTYL) 10 MG capsule Take 1 capsule (10 mg total) by mouth 4 (four) times daily -  before meals and at bedtime. 10/15/13   Ignacia Felling, PA-C  docusate sodium (COLACE) 100 MG capsule Take 1 capsule (100 mg total) by mouth  every 12 (twelve) hours. 10/15/13   Ignacia Felling, PA-C  methocarbamol (ROBAXIN) 500 MG tablet Take 1 tablet (500 mg total) by mouth 2 (two) times daily. 01/02/19   Noe Gens, PA-C  naproxen sodium (ANAPROX) 220 MG tablet Take 440 mg by mouth as needed (pain).    [provider]  omeprazole (PRILOSEC) 40 MG capsule Take 40 mg by mouth daily.    [provider]  ondansetron (ZOFRAN ODT) 4 MG disintegrating tablet Take 1 tablet (4 mg total) by mouth every 6 (six) hours as needed for nausea or vomiting. 06/15/18   Noe Gens, PA-C  ondansetron (ZOFRAN) 4 MG tablet Take 1 tablet (4 mg total) by mouth every 6 (six) hours. 10/11/18   Noe Gens, PA-C  Prenatal Vit-Fe Fumarate-FA (PRENATAL VITAMINS PO) Take by mouth.    [provider]    Family History No family history on file.  Social History Social History   Tobacco Use  . Smoking status: Current Every Day Smoker    Packs/day: 0.50    Years: 5.00  Pack years: 2.50    Types: Cigarettes  . Smokeless tobacco: Never Used  Substance Use Topics  . Alcohol use: Yes    Alcohol/week: 20.0 standard drinks    Types: 20 Shots of liquor per week  . Drug use: No     Allergies   Patient has no known allergies.   Review of Systems Review of Systems No sore throat + cough + sneezing No pleuritic pain No wheezing + nasal congestion + post-nasal drainage No sinus pain/pressure No itchy/red eyes No earache No hemoptysis No SOB No fever/+ chills No nausea No vomiting No abdominal pain + diarrhea, resolved No urinary symptoms No skin rash + fatigue + myalgias + headache    Physical Exam Triage Vital Signs ED Triage Vitals  Enc Vitals Group     BP 06/21/19 1652 113/71     Pulse Rate 06/21/19 1652 90     Resp 06/21/19 1652 16     Temp 06/21/19 1652 98.3 F (36.8 C)     Temp Source 06/21/19 1652 Oral     SpO2 06/21/19 1652 100 %     Weight 06/21/19 1653 168 lb (76.2 kg)     Height  06/21/19 1653 5' 5.25" (1.657 m)     Head Circumference --      Peak Flow --      Pain Score 06/21/19 1653 3     Pain Loc --      Pain Edu? --      Excl. in [place]? --    No data found.  Updated Vital Signs BP 113/71 (BP Location: Right Arm)   Pulse 90   Temp 98.3 F (36.8 C) (Oral)   Resp 16   Ht 5' 5.25" (1.657 m)   Wt 76.2 kg   LMP  (LMP Unknown)   SpO2 100%   BMI 27.74 kg/m   Visual Acuity Right Eye Distance:   Left Eye Distance:   Bilateral Distance:    Right Eye Near:   Left Eye Near:    Bilateral Near:     Physical Exam Nursing notes and Vital Signs reviewed. Appearance:  Patient appears stated age, and in no acute distress Eyes:  Pupils are equal, round, and reactive to light and accomodation.  Extraocular movement is intact.  Conjunctivae are not inflamed  Ears:  Canals normal.  Tympanic membranes normal.  Nose:  Mildly congested turbinates.  No sinus tenderness.   Pharynx:  Normal Neck:  Supple.  Enlarged posterior/lateral nodes are palpated bilaterally, tender to palpation on the left.   Lungs:  Clear to auscultation.  Breath sounds are equal.  Moving air well. Heart:  Regular rate and rhythm without murmurs, rubs, or gallops.  Abdomen:  Nontender without masses or hepatosplenomegaly.  Bowel sounds are present.  No CVA or flank tenderness.  Extremities:  No edema.  Skin:  No rash present.    UC Treatments / Results  Labs (all labs ordered are listed, but only abnormal results are displayed) Labs Reviewed  POC SARS CORONAVIRUS 2 ED negative    EKG   Radiology No results found.  Procedures Procedures (including critical care time)  Medications Ordered in UC Medications - No data to display  Initial Impression / Assessment and Plan / UC Course  I have reviewed the triage vital signs and the nursing notes.  Pertinent labs & imaging results that were available during my care of the patient were reviewed by me and considered in my medical decision  making (  see chart for details).    There is no evidence of bacterial infection today.  Treat symptomatically for now  COVID19 send out. Followup with OBGYN for headaches.   Final Clinical Impressions(s) / UC Diagnoses   Final diagnoses:  Viral URI with cough  Exposure to COVID-19 virus     Discharge Instructions     Take plain guaifenesin (1249m extended release tabs such as Mucinex) twice daily, with plenty of water, for cough and congestion.    Get adequate rest.   Recommend using saline nasal spray several times daily and saline nasal irrigation (AYR is a common brand).   Try warm salt water gargles for sore throat.  May take Tylenol for pain. Stop all antihistamines for now, and other non-prescription cough/cold preparations. May take Delsym Cough Suppressant at bedtime for nighttime cough.   If symptoms become significantly worse during the night or over the weekend, proceed to the local emergency room.   If COVID-19 test is positive, isolate yourself until the below conditions are met: 1)  At least 7 days since symptoms onset. AND 2)  > 72 hours after symptom resolution (absence of fever without the use of fever-reducing medicine, and improvement in respiratory symptoms.        ED Prescriptions    None        BKandra Nicolas MD 06/22/19 [phone]

## 2019-06-21 NOTE — Discharge Instructions (Addendum)
Take plain guaifenesin (1269m extended release tabs such as Mucinex) twice daily, with plenty of water, for cough and congestion.    Get adequate rest.   Recommend using saline nasal spray several times daily and saline nasal irrigation (AYR is a common brand).   Try warm salt water gargles for sore throat.  May take Tylenol for pain. Stop all antihistamines for now, and other non-prescription cough/cold preparations. May take Delsym Cough Suppressant at bedtime for nighttime cough.   If symptoms become significantly worse during the night or over the weekend, proceed to the local emergency room.   If COVID-19 test is positive, isolate yourself until the below conditions are met: 1)  At least 7 days since symptoms onset. AND 2)  > 72 hours after symptom resolution (absence of fever without the use of fever-reducing medicine, and improvement in respiratory symptoms.

## 2019-06-24 ENCOUNTER — Telehealth: Payer: Self-pay

## 2019-06-24 NOTE — Telephone Encounter (Signed)
Pt called for COVID results.  Neg results given.

## 2019-08-20 IMAGING — DX DG CHEST 2V
2 series · 2 of 2 positions shown · non-contrast
Comparison: 10/15/2013

CLINICAL DATA: Cough and chest tightness.

EXAM:
CHEST - 2 VIEW

[chest pa]
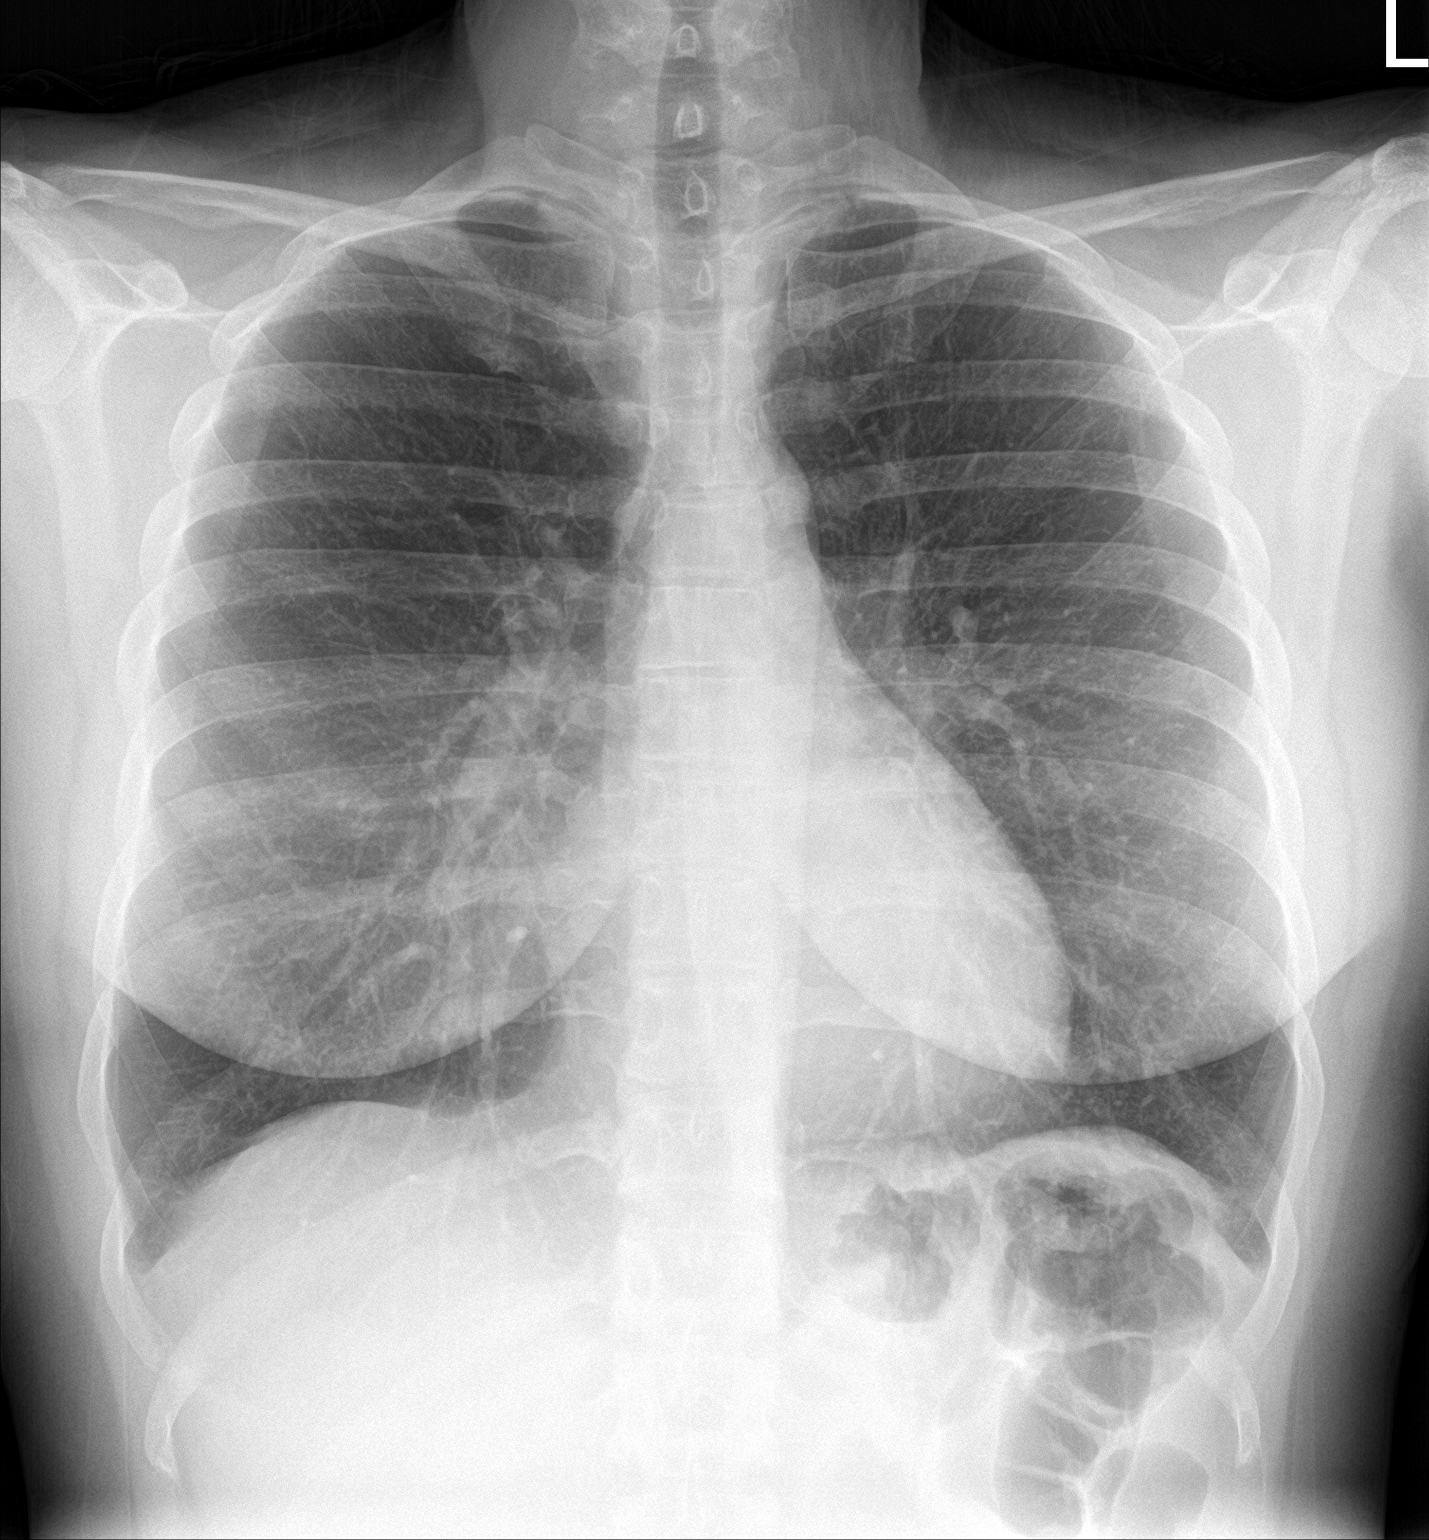

[chest lat]
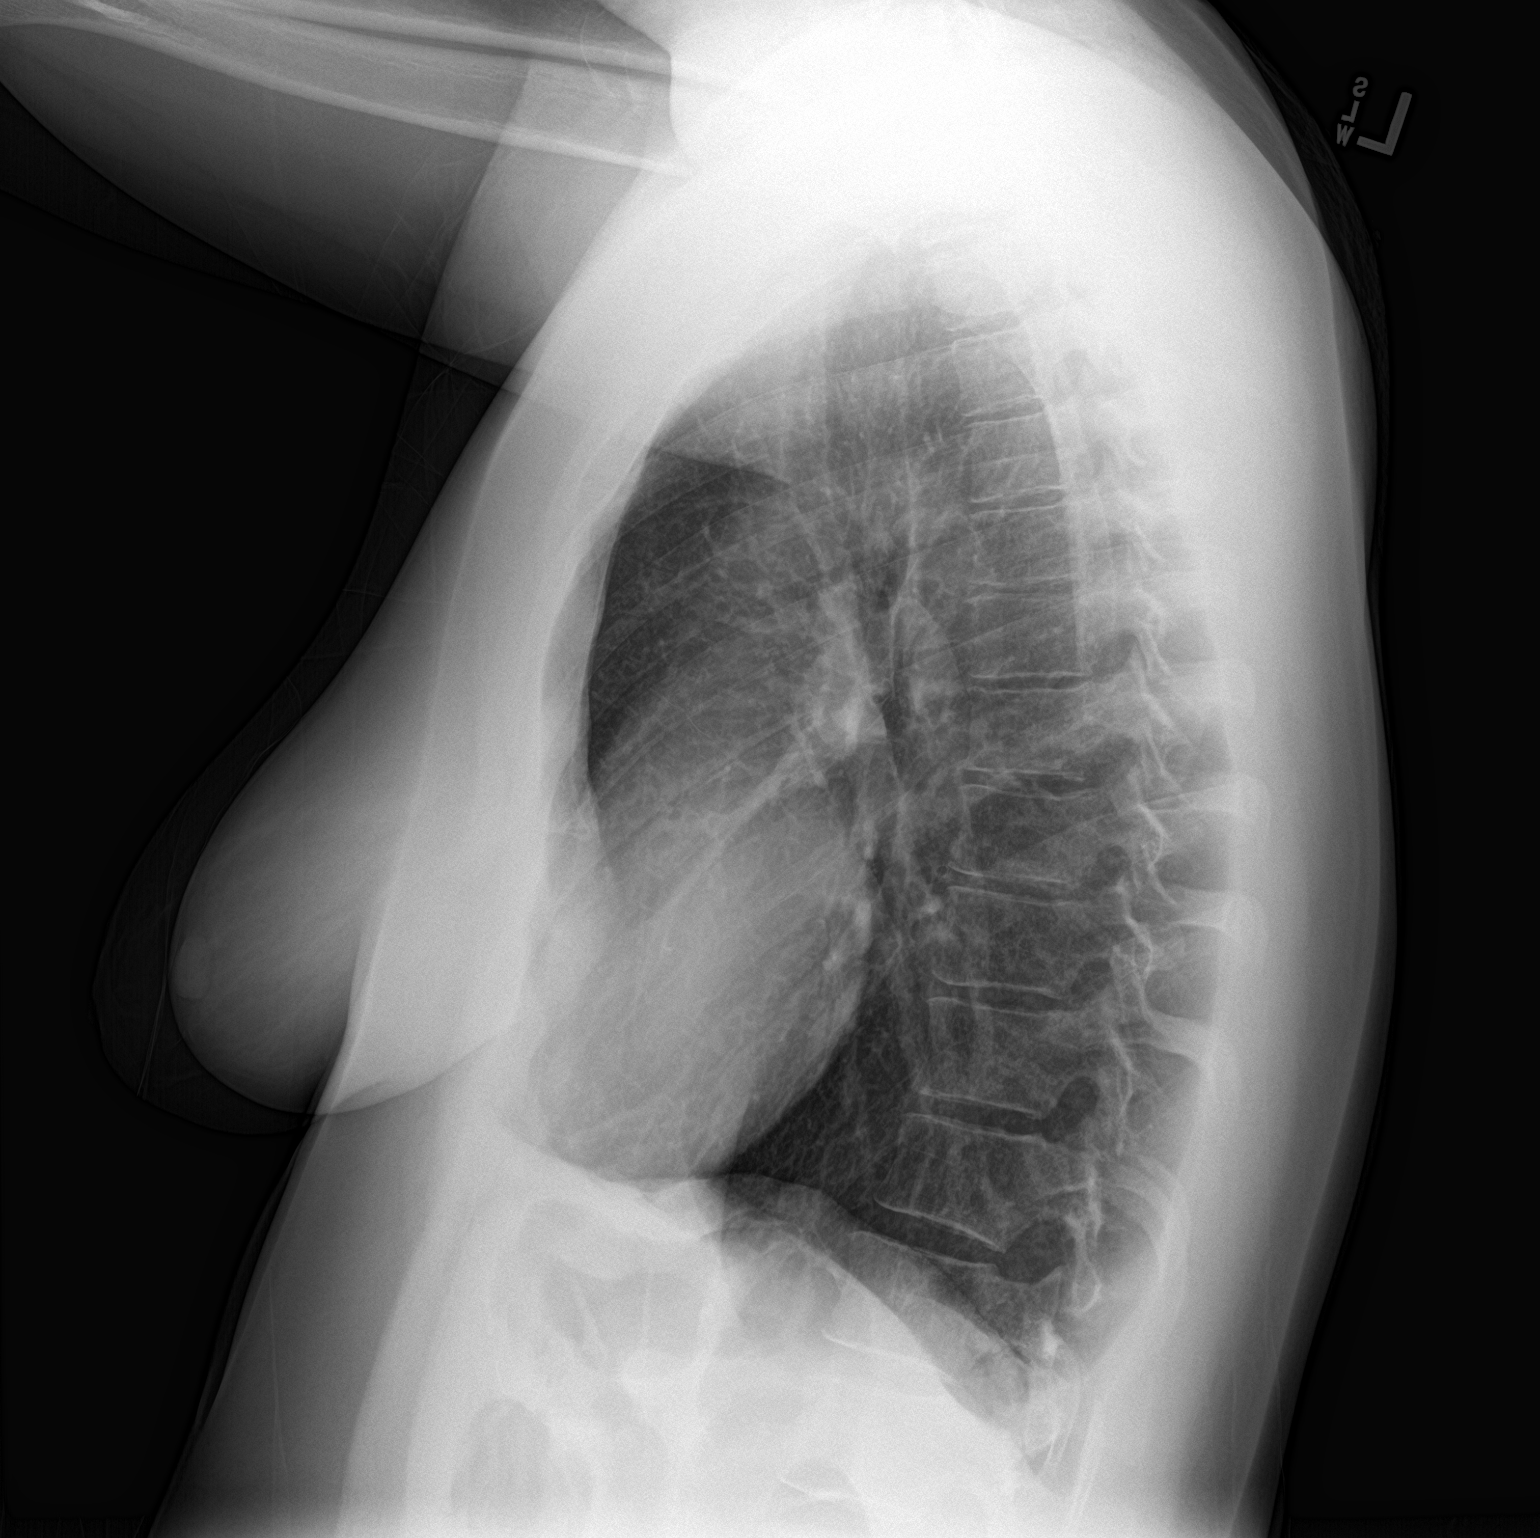

[2 of 2 positions shown; findings below may reference images not displayed]

FINDINGS: Normal heart size and mediastinal contours. No acute infiltrate or
edema. No effusion or pneumothorax. Pectus excavatum. No acute
osseous findings.
IMPRESSION: Negative chest.

## 2020-01-25 ENCOUNTER — Emergency Department (INDEPENDENT_AMBULATORY_CARE_PROVIDER_SITE_OTHER): Payer: 59

## 2020-01-25 ENCOUNTER — Other Ambulatory Visit: Payer: Self-pay

## 2020-01-25 ENCOUNTER — Emergency Department (INDEPENDENT_AMBULATORY_CARE_PROVIDER_SITE_OTHER)
Admission: EM | Admit: 2020-01-25 | Discharge: 2020-01-25 | Disposition: A | Discharge status: Home / Self Care | Payer: 59 | Source: Home / Self Care | Attending: Family Medicine | Admitting: Family Medicine

## 2020-01-25 ENCOUNTER — Encounter: Payer: Self-pay | Admitting: Emergency Medicine

## 2020-01-25 DIAGNOSIS — T1490XA Injury, unspecified, initial encounter: Secondary | ICD-10-CM | POA: Diagnosis not present

## 2020-01-25 DIAGNOSIS — S60221A Contusion of right hand, initial encounter: Secondary | ICD-10-CM

## 2020-01-25 NOTE — Discharge Instructions (Addendum)
Apply ice pack for 30 minutes every 1 to 2 hours today and tomorrow.  Elevate.  Wear Ace wrap until swelling decreases.  May take Ibuprofen 200mg , 3 or 4 tabs every 8 hours with food.  Begin gentle range of motion exercises in about 3 to 4 days.

## 2020-01-25 NOTE — ED Triage Notes (Signed)
Patient had dresser fall on her right hand while attempting to move it about an hour ago; took ibuprofen. Patient was covid positive on 01/18/20; doing well and was not hospitalized.

## 2020-01-29 NOTE — ED Provider Notes (Signed)
[facility]    CSN: [id number] Arrival date & time: 01/25/20  1919      History   Chief Complaint Chief Complaint  Patient presents with  . Hand Injury    HPI Virginia Wong is a 34 y.o. female.   While moving a heavy wood dresser about two hours ago, it slipped and fell on patient's right hand.   The history is provided by the patient and the spouse.  Hand Injury Location:  Hand Hand location:  [place] of R hand Injury: yes   Time since incident:  2 hours Mechanism of injury: crush   Crush injury:    Mechanism:  Falling object Pain details:    Quality:  Aching   Radiates to:  R forearm   Severity:  Moderate   Onset quality:  Sudden   Duration:  2 hours   Timing:  Constant   Progression:  Unchanged Handedness:  Right-handed Prior injury to area:  No Relieved by:  None tried Worsened by:  Movement Ineffective treatments:  None tried Associated symptoms: decreased range of motion and swelling   Associated symptoms: no numbness and no tingling     Past Medical History:  Diagnosis Date  . ADHD     There are no problems to display for this patient.   Past Surgical History:  Procedure Laterality Date  . WISDOM TOOTH EXTRACTION      OB History    Gravida  1   Para      Term      Preterm      AB      Living        SAB      TAB      Ectopic      Multiple      Live Births               Home Medications    Prior to Admission medications   Medication Sig Start Date End Date Taking? Authorizing Provider  amphetamine-dextroamphetamine (ADDERALL) 20 MG tablet Take 20 mg by mouth 2 (two) times daily.    [provider]  azithromycin (AZASITE) 1 % ophthalmic solution Place 1gtt in each eye BID (8-12hr) for two days, then one daily for 5 days 06/13/19   Kandra Nicolas, MD  azithromycin (ZITHROMAX) 250 MG tablet Take 1 tablet (250 mg total) by mouth daily. Take first 2 tablets together, then 1 every day until  finished. 06/15/18   Noe Gens, PA-C  cephALEXin (KEFLEX) 500 MG capsule Take 1 capsule (500 mg total) by mouth 3 (three) times daily. 10/11/18   Noe Gens, PA-C  dicyclomine (BENTYL) 10 MG capsule Take 1 capsule (10 mg total) by mouth 4 (four) times daily -  before meals and at bedtime. 10/15/13   Ignacia Felling, PA-C  docusate sodium (COLACE) 100 MG capsule Take 1 capsule (100 mg total) by mouth every 12 (twelve) hours. 10/15/13   Ignacia Felling, PA-C  methocarbamol (ROBAXIN) 500 MG tablet Take 1 tablet (500 mg total) by mouth 2 (two) times daily. 01/02/19   Noe Gens, PA-C  naproxen sodium (ANAPROX) 220 MG tablet Take 440 mg by mouth as needed (pain).    [provider]  omeprazole (PRILOSEC) 40 MG capsule Take 40 mg by mouth daily.    [provider]  ondansetron (ZOFRAN ODT) 4 MG disintegrating tablet Take 1 tablet (4 mg total) by mouth every 6 (six) hours as needed for nausea  or vomiting. 06/15/18   Lurene Shadow, PA-C  ondansetron (ZOFRAN) 4 MG tablet Take 1 tablet (4 mg total) by mouth every 6 (six) hours. 10/11/18   Lurene Shadow, PA-C  Prenatal Vit-Fe Fumarate-FA (PRENATAL VITAMINS PO) Take by mouth.    [provider]    Family History No family history on file.  Social History Social History   Tobacco Use  . Smoking status: Current Every Day Smoker    Packs/day: 0.50    Years: 5.00    Pack years: 2.50    Types: Cigarettes  . Smokeless tobacco: Never Used  Vaping Use  . Vaping Use: Never used  Substance Use Topics  . Alcohol use: Yes    Alcohol/week: 20.0 standard drinks    Types: 20 Shots of liquor per week  . Drug use: No     Allergies   Patient has no known allergies.   Review of Systems Review of Systems  Skin: Positive for color change. Negative for wound.  All other systems reviewed and are negative.    Physical Exam Triage Vital Signs ED Triage Vitals  Enc Vitals Group     BP 01/25/20 1935 113/73     Pulse  Rate 01/25/20 1935 97     Resp 01/25/20 1935 16     Temp 01/25/20 1935 99.4 F (37.4 C)     Temp Source 01/25/20 1935 Oral     SpO2 01/25/20 1935 98 %     Weight 01/25/20 1937 165 lb (74.8 kg)     Height 01/25/20 1937 5\' 6"  (1.676 m)     Head Circumference --      Peak Flow --      Pain Score 01/25/20 1937 6     Pain Loc --      Pain Edu? --      Excl. in [place]? --    No data found.  Updated Vital Signs BP 113/73 (BP Location: Left Arm)   Pulse 97   Temp 99.4 F (37.4 C) (Oral)   Resp 16   Ht 5\' 6"  (1.676 m)   Wt 74.8 kg   SpO2 98%   Breastfeeding Yes   BMI 26.63 kg/m   Visual Acuity Right Eye Distance:   Left Eye Distance:   Bilateral Distance:    Right Eye Near:   Left Eye Near:    Bilateral Near:     Physical Exam Vitals and nursing note reviewed.  Constitutional:      General: She is not in acute distress. HENT:     Head: Atraumatic.  Eyes:     Pupils: Pupils are equal, round, and reactive to light.  Cardiovascular:     Rate and Rhythm: Normal rate.  Pulmonary:     Effort: Pulmonary effort is normal.  Musculoskeletal:       Hands:     Comments: Dorsum of right hand diffusely swollen and tender to palpation.  Mild ecchymosis present.  Distal neurovascular function is intact.  Flexion/extension of fingers intact.  Skin:    General: Skin is warm and dry.  Neurological:     Mental Status: She is alert and oriented to person, place, and time.      UC Treatments / Results  Labs (all labs ordered are listed, but only abnormal results are displayed) Labs Reviewed - No data to display  EKG   Radiology  Narrative & Impression  CLINICAL DATA:  Right hand pain about fourth and fifth metacarpals. Injury, dresser  fell on hand.  EXAM: RIGHT HAND - COMPLETE 3+ VIEW  COMPARISON:  None.  FINDINGS: No acute fracture or dislocation. There is a remote healed fracture deformity of the fifth metacarpal. There is no evidence of arthropathy or other  focal bone abnormality. Soft tissues are unremarkable.  IMPRESSION: No acute fracture or subluxation of the right hand.   Electronically Signed   By: Narda Rutherford M.D.   On: 01/25/2020 20:09    Procedures Procedures (including critical care time)  Medications Ordered in UC Medications - No data to display  Initial Impression / Assessment and Plan / UC Course  I have reviewed the triage vital signs and the nursing notes.  Pertinent labs & imaging results that were available during my care of the patient were reviewed by me and considered in my medical decision making (see chart for details).    No evidence fracture.  Ace wrap applied. Followup with [facility] if not improving about two weeks.    Final Clinical Impressions(s) / UC Diagnoses   Final diagnoses:  Contusion of right hand, initial encounter     Discharge Instructions     Apply ice pack for 30 minutes every 1 to 2 hours today and tomorrow.  Elevate.  Wear Ace wrap until swelling decreases.  May take Ibuprofen 200mg , 3 or 4 tabs every 8 hours with food.  Begin gentle range of motion exercises in about 3 to 4 days.      ED Prescriptions    None        , MD 01/29/20 1409

## 2021-03-31 IMAGING — DX DG HAND COMPLETE 3+V*R*
3 series · 3 of 3 positions shown · non-contrast
Comparison: None.

CLINICAL DATA: Right hand pain about fourth and fifth metacarpals.
Injury, dresser fell on hand.

EXAM:
RIGHT HAND - COMPLETE 3+ VIEW

[hand pa]
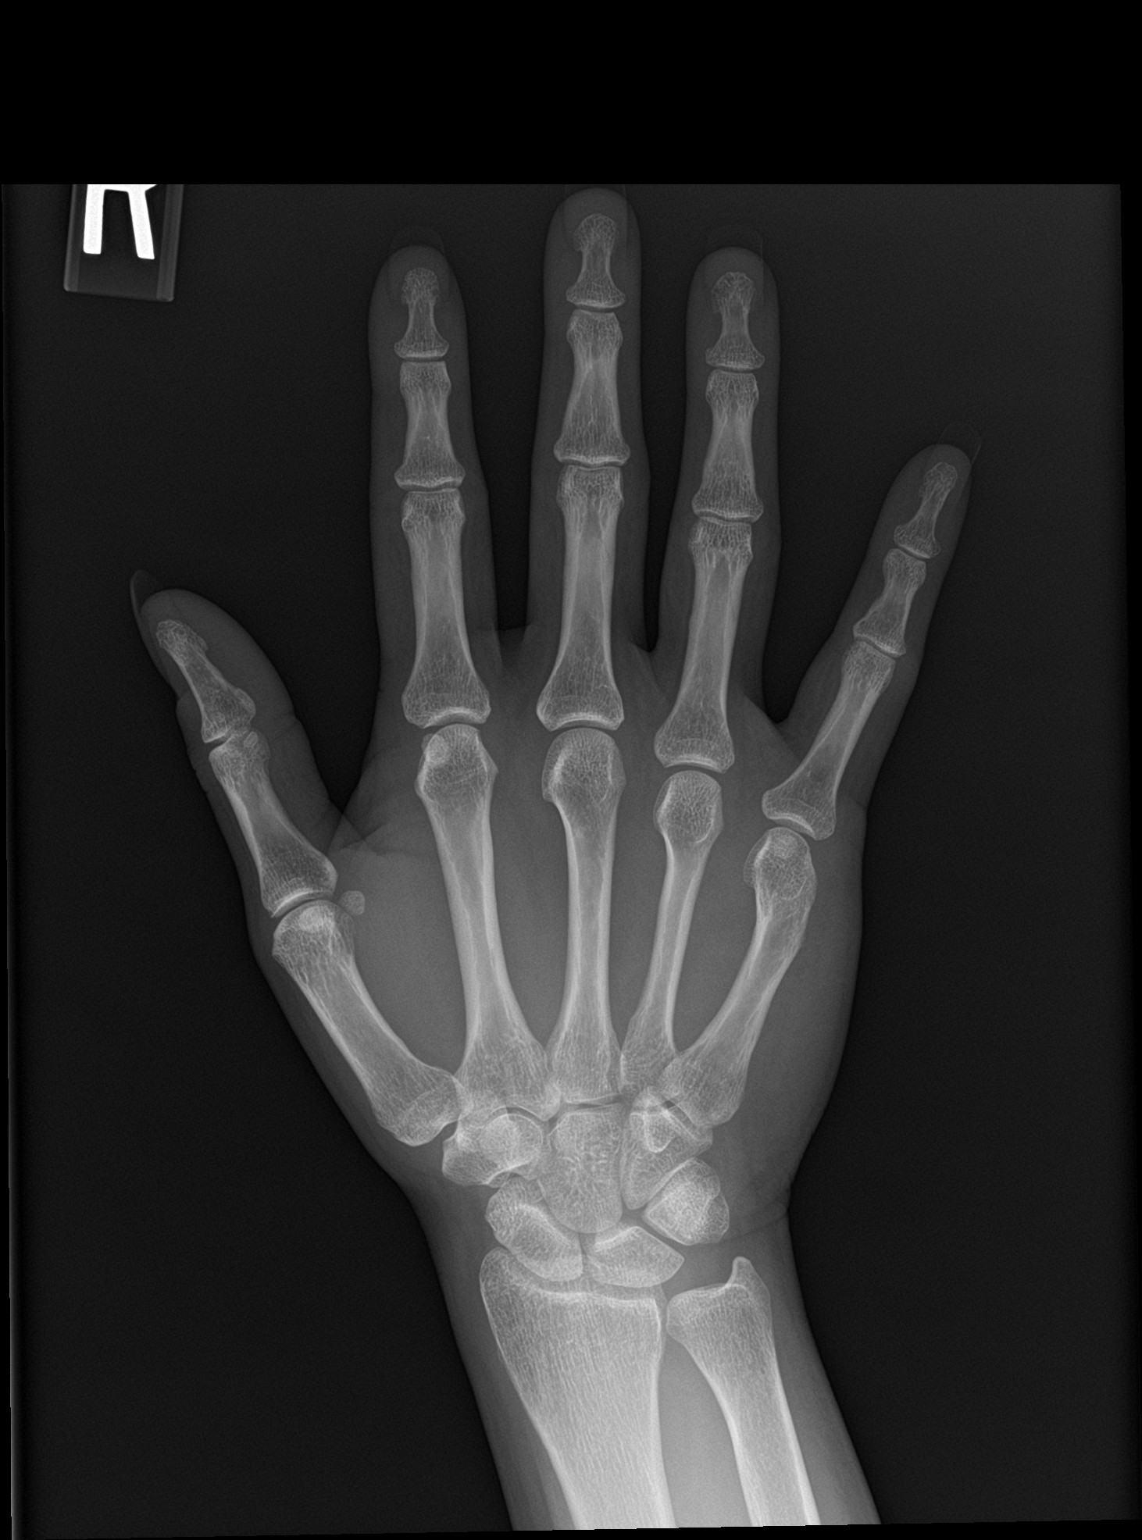

[hand obl]
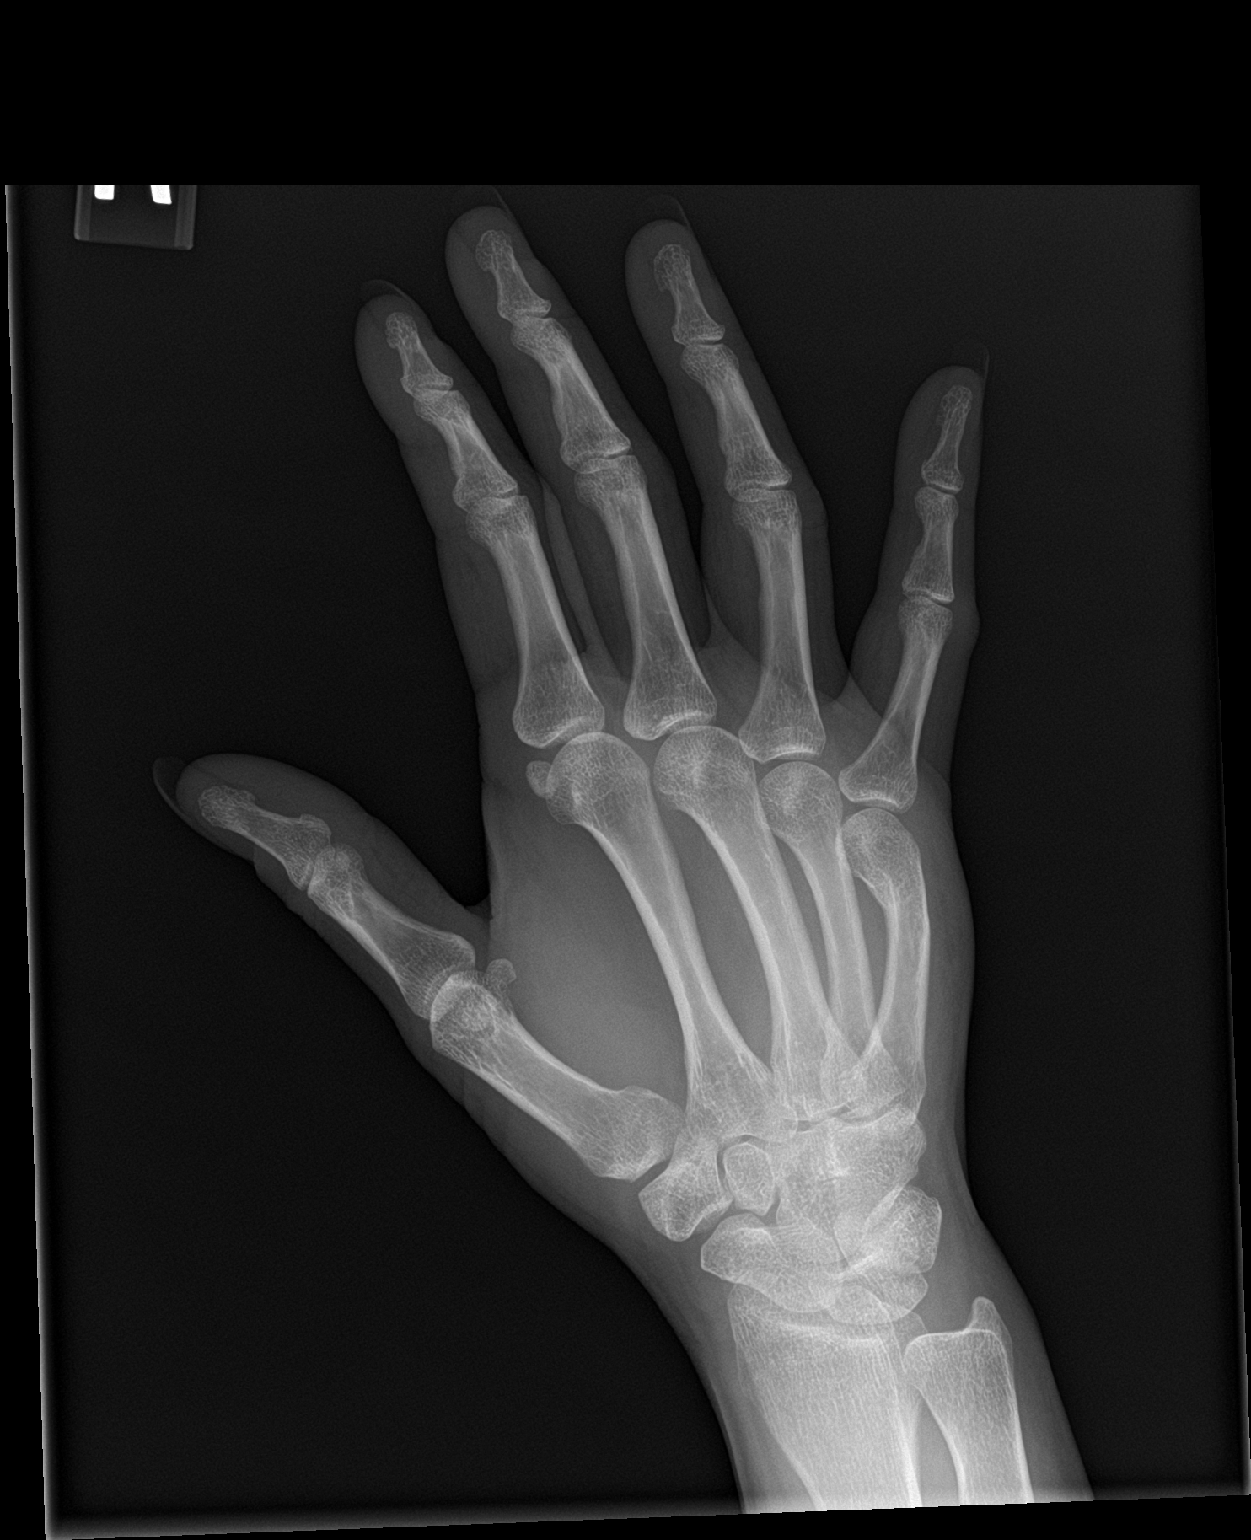

[hand lat]
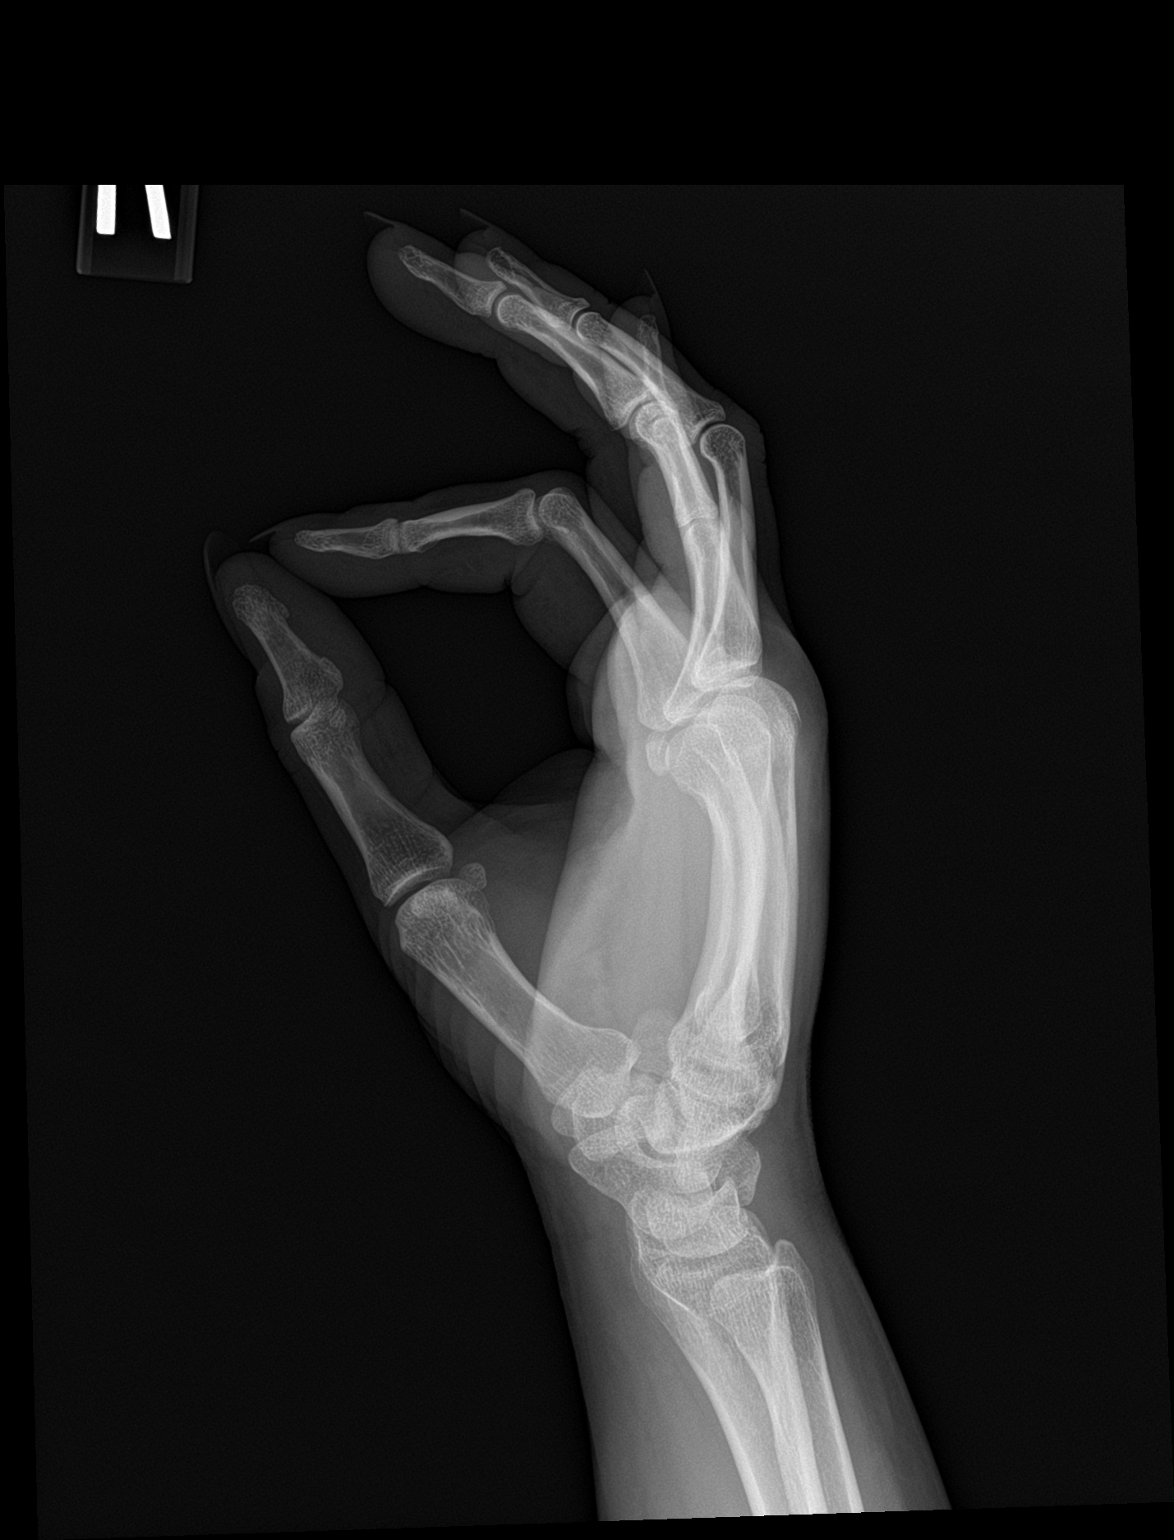

[3 of 3 positions shown; findings below may reference images not displayed]

FINDINGS: No acute fracture or dislocation. There is a remote healed fracture
deformity of the fifth metacarpal. There is no evidence of
arthropathy or other focal bone abnormality. Soft tissues are
unremarkable.
IMPRESSION: No acute fracture or subluxation of the right hand.

## 2021-07-12 ENCOUNTER — Other Ambulatory Visit: Payer: Self-pay

## 2021-07-12 ENCOUNTER — Emergency Department (INDEPENDENT_AMBULATORY_CARE_PROVIDER_SITE_OTHER)
Admission: RE | Admit: 2021-07-12 | Discharge: 2021-07-12 | Disposition: A | Discharge status: Home / Self Care | Payer: Medicaid Other | Source: Ambulatory Visit | Attending: Family Medicine | Admitting: Family Medicine

## 2021-07-12 ENCOUNTER — Emergency Department (INDEPENDENT_AMBULATORY_CARE_PROVIDER_SITE_OTHER): Payer: Medicaid Other

## 2021-07-12 VITALS — BP 109/72 | HR 78 | Temp 99.3°F | Resp 16 | Ht 65.0 in | Wt 160.0 lb

## 2021-07-12 DIAGNOSIS — M778 Other enthesopathies, not elsewhere classified: Secondary | ICD-10-CM

## 2021-07-12 DIAGNOSIS — M7989 Other specified soft tissue disorders: Secondary | ICD-10-CM | POA: Diagnosis not present

## 2021-07-12 DIAGNOSIS — M79632 Pain in left forearm: Secondary | ICD-10-CM

## 2021-07-12 MED ORDER — PREDNISONE 20 MG PO TABS: ORAL_TABLET | ORAL | 0 refills | Status: AC

## 2021-07-12 NOTE — ED Provider Notes (Signed)
[name]    CSN: [id number] Arrival date & time: 07/12/21  1657      History   Chief Complaint Chief Complaint  Patient presents with   Arm Pain    HPI Virginia Wong is a 35 y.o. female.   Patient complains of onset of pain in her left forearm three days ago followed by swelling.  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   Arm Pain This is a new problem. The current episode started more than 2 days ago. The problem occurs constantly. The problem has been gradually worsening. Exacerbated by: left wrist and forearm movement. Nothing relieves the symptoms. She has tried nothing for the symptoms.   Past Medical History:  Diagnosis Date   ADHD     There are no problems to display for this patient.   Past Surgical History:  Procedure Laterality Date   WISDOM TOOTH EXTRACTION      OB History     Gravida  1   Para      Term      Preterm      AB      Living         SAB      IAB      Ectopic      Multiple      Live Births               Home Medications    Prior to Admission medications   Medication Sig Start Date End Date Taking? Authorizing Provider  predniSONE (DELTASONE) 20 MG tablet Take one tab by mouth twice daily for 4 days, then one daily. Take with food. 07/12/21  Yes Lattie Haw, MD  amphetamine-dextroamphetamine (ADDERALL) 20 MG tablet Take 20 mg by mouth 2 (two) times daily.    [provider]  omeprazole (PRILOSEC) 40 MG capsule Take 40 mg by mouth daily.    [provider]    Family History History reviewed. No pertinent family history.  Social History Social History   Tobacco Use   Smoking status: Every Day    Packs/day: 0.50    Years: 5.00    Pack years: 2.50    Types: Cigarettes   Smokeless tobacco: Never  Vaping Use   Vaping Use: Never used  Substance Use Topics   Alcohol use: Yes    Alcohol/week:  20.0 standard drinks    Types: 20 Shots of liquor per week   Drug use: No     Allergies   Patient has no known allergies.   Review of Systems Review of Systems  Constitutional: Negative.   HENT: Negative.    Eyes: Negative.   Respiratory: Negative.    Cardiovascular: Negative.   Gastrointestinal: Negative.   Musculoskeletal:  Negative for joint swelling.       Left forearm pain.  Skin:  Negative for color change and rash.  Neurological:  Negative for numbness.  Hematological:  Negative for adenopathy.    Physical Exam Triage Vital Signs ED Triage Vitals  Enc Vitals Group     BP 07/12/21 1720 109/72     Pulse Rate 07/12/21 1720 78     Resp 07/12/21 1720 16     Temp 07/12/21 1720 99.3 F (37.4 C)     Temp Source 07/12/21 1720 Oral     SpO2 07/12/21 1720 100 %     Weight 07/12/21 1721  160 lb (72.6 kg)     Height 07/12/21 1721 5\' 5"  (1.651 m)     Head Circumference --      Peak Flow --      Pain Score 07/12/21 1720 6     Pain Loc --      Pain Edu? --      Excl. in GC? --    No data found.  Updated Vital Signs BP 109/72 (BP Location: Right Arm)   Pulse 78   Temp 99.3 F (37.4 C) (Oral)   Resp 16   Ht 5\' 5"  (1.651 m)   Wt 72.6 kg   SpO2 100%   BMI 26.63 kg/m   Visual Acuity Right Eye Distance:   Left Eye Distance:   Bilateral Distance:    Right Eye Near:   Left Eye Near:    Bilateral Near:     Physical Exam Vitals and nursing note reviewed.  Constitutional:      General: She is not in acute distress. HENT:     Head: Normocephalic.  Eyes:     Pupils: Pupils are equal, round, and reactive to light.  Cardiovascular:     Rate and Rhythm: Normal rate.  Pulmonary:     Effort: Pulmonary effort is normal.  Musculoskeletal:     Left forearm: Swelling and tenderness present. No bony tenderness.       Arms:     Comments: Left forearm has tenderness to palpation and mild swelling over the radial aspect of her mid/distal forearm, but does not extend to  her left thumb extensor tendons.  She has pain in her dorsal forearm with resisted dorsiflexion of her wrist and radial flexion of her wrist.  Distal neurovascular function is intact.   Skin:    General: Skin is warm and dry.  Neurological:     General: No focal deficit present.     Mental Status: She is alert.     UC Treatments / Results  Labs (all labs ordered are listed, but only abnormal results are displayed) Labs Reviewed - No data to display  EKG   Radiology DG Forearm Left  Result Date: 07/12/2021 CLINICAL DATA:  Arm pain and swelling EXAM: LEFT FOREARM - 2 VIEW COMPARISON:  None. FINDINGS: There is no evidence of fracture or other focal bone lesions. No radiopaque foreign bodies. IMPRESSION: No acute osseous abnormality identified. Electronically Signed   By: M.D.   On: 07/12/2021 17:40    Procedures Procedures (including critical care time)  Medications Ordered in UC Medications - No data to display  Initial Impression / Assessment and Plan / UC Course  I have reviewed the triage vital signs and the nursing notes.  Pertinent labs & imaging results that were available during my care of the patient were reviewed by me and considered in my medical decision making (see chart for details).    Wrist/forearm splint dispensed. Begin prednisone burst/taper. Given sprain treatment instructions with range of motion and stretching exercises.  Followup with Dr. [facility]ann[facility] ([facility]) if not improving about two weeks.   Final Clinical Impressions(s) / UC Diagnoses   Final diagnoses:  Forearm tendonitis     Discharge Instructions      Apply ice pack for 20 to 30 minutes, 3 to 4 times daily  Continue until pain and swelling decrease.  Wear wrist/forearm splint for about one week until improved.  May take Tylenol as needed for pain. After finishing prednisone, may take Ibuprofen  200mg , 4 tabs every 8 hours with food for 3 to 5  days.    ED Prescriptions     Medication Sig Dispense Auth. Provider   predniSONE (DELTASONE) 20 MG tablet Take one tab by mouth twice daily for 4 days, then one daily. Take with food. 12 tablet , MD         Lattie Haw, MD 07/13/21 [phone]

## 2021-07-12 NOTE — ED Triage Notes (Signed)
Left arm pain Denies injury x 3 days

## 2021-07-12 NOTE — Discharge Instructions (Addendum)
Apply ice pack for 20 to 30 minutes, 3 to 4 times daily  Continue until pain and swelling decrease.  Wear wrist/forearm splint for about one week until improved.  May take Tylenol as needed for pain. Begin range of motion and stretching exercises as tolerated.. After finishing prednisone, may take Ibuprofen 200mg , 4 tabs every 8 hours with food for 3 to 5 days.

## 2024-08-24 ENCOUNTER — Other Ambulatory Visit: Payer: Self-pay | Admitting: Occupational Medicine

## 2024-08-24 DIAGNOSIS — S39012A Strain of muscle, fascia and tendon of lower back, initial encounter: Secondary | ICD-10-CM
